# Patient Record
Sex: Female | Born: 1990
Health system: Southern US, Community
[De-identification: ages and names within clinical notes are randomized; demographics above are authoritative.]

## PROBLEM LIST (undated history)

## (undated) DIAGNOSIS — E282 Polycystic ovarian syndrome: Secondary | ICD-10-CM

## (undated) DIAGNOSIS — Z8619 Personal history of other infectious and parasitic diseases: Secondary | ICD-10-CM

## (undated) DIAGNOSIS — F419 Anxiety disorder, unspecified: Secondary | ICD-10-CM

## (undated) HISTORY — PX: OTHER SURGICAL HISTORY: SHX169

## (undated) HISTORY — DX: Polycystic ovarian syndrome: E28.2

## (undated) HISTORY — DX: Anxiety disorder, unspecified: F41.9

## (undated) HISTORY — PX: WISDOM TOOTH EXTRACTION: SHX21

## (undated) HISTORY — DX: Personal history of other infectious and parasitic diseases: Z86.19

---
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
## 2010-02-04 ENCOUNTER — Emergency Department (HOSPITAL_COMMUNITY)
Admission: EM | Admit: 2010-02-04 | Discharge: 2010-02-04 | Payer: Self-pay | Source: Home / Self Care | Admitting: Family Medicine

## 2010-02-04 LAB — POCT RAPID STREP A (OFFICE): Streptococcus, Group A Screen (Direct): NEGATIVE

## 2012-07-18 ENCOUNTER — Emergency Department (HOSPITAL_COMMUNITY)
Admission: EM | Admit: 2012-07-18 | Discharge: 2012-07-18 | Disposition: A | Discharge status: Home / Self Care | Payer: 59 | Source: Home / Self Care | Attending: Family Medicine | Admitting: Family Medicine

## 2012-07-18 ENCOUNTER — Encounter (HOSPITAL_COMMUNITY): Payer: Self-pay | Admitting: Emergency Medicine

## 2012-07-18 DIAGNOSIS — R059 Cough, unspecified: Secondary | ICD-10-CM

## 2012-07-18 DIAGNOSIS — M549 Dorsalgia, unspecified: Secondary | ICD-10-CM

## 2012-07-18 DIAGNOSIS — R05 Cough: Secondary | ICD-10-CM

## 2012-07-18 LAB — POCT PREGNANCY, URINE: Preg Test, Ur: NEGATIVE

## 2012-07-18 LAB — POCT URINALYSIS DIP (DEVICE)
Protein, ur: NEGATIVE mg/dL
Specific Gravity, Urine: >=1.03 (ref 1.005–1.030)
Urobilinogen, UA: 0.2 mg/dL (ref 0.0–1.0)

## 2012-07-18 MED ORDER — CYCLOBENZAPRINE HCL 5 MG PO TABS: 5.0000 mg | ORAL_TABLET | Freq: Three times a day (TID) | ORAL | Status: DC | PRN

## 2012-07-18 MED ORDER — FLUTICASONE PROPIONATE 50 MCG/ACT NA SUSP: 2.0000 | Freq: Every day | NASAL | Status: DC

## 2012-07-18 MED ORDER — DEXTROMETHORPHAN POLISTIREX 30 MG/5ML PO LQCR: 30.0000 mg | Freq: Two times a day (BID) | ORAL | Status: DC

## 2012-07-18 NOTE — ED Notes (Signed)
Pt c/o a persistent deep productive cough x [redacted] wks along with back pain.  Pt ?'s if it from cough so hard. Pt denies urinary symptoms.  Pt has tried otc medications with no relief in symptoms.  Pt states that she has seen a chiropractor and states that she has a small misalignment in her back.

## 2012-07-18 NOTE — ED Provider Notes (Signed)
   History    CSN: [id number] Arrival date & time 07/18/12  1633  First MD Initiated Contact with Patient 07/18/12 1721     Chief Complaint  Patient presents with  . Cough    deep productive cough x 2 wks.  lower back pain ?'s if its because of cough. pt denies urinary symptoms.    HPI Cough: started 2wks ago. Chronic h/o intermittent coughing episodes, since high school. Typically last a few days. Non-smoker. Denies rhinorrhea, recent illnesses, fevers, allergies. Tried mucinex, Theraflu, zyrtec w/o any relief. No h/o chronic respiratory illnesses. Cough is worse at night. Occasionally productive.   Lower back pain: Started 3 wks ago. Went to chiropractor who stated pt had a misalignment. Can not afford adjustments from chiropractor. ASA w/o benefit. Denies any trauma. Pain is sharp and deep. Exacerbated w/ certain movements.    History reviewed. No pertinent past medical history. History reviewed. No pertinent past surgical history. History reviewed. No pertinent family history. History  Substance Use Topics  . Smoking status: Never Smoker   . Smokeless tobacco: Not on file  . Alcohol Use: No   OB History   Grav Para Term Preterm Abortions TAB SAB Ect Mult Living                 Review of Systems Per HPI w/ all other systems negative Allergies  Review of patient's allergies indicates no known allergies.  Home Medications  No current outpatient prescriptions on file. BP 131/85  Pulse 70  Temp(Src) 99.3 F (37.4 C) (Oral)  Resp 18  SpO2 99% Physical Exam Gen: NAD, WNWD HEENT: Nasal turbinates pink, tonsils 2+, no pharyngeal injection, no cervical lymphadenopathy CV: RRR, no m/r/g Res: CTAB. , nml effort Abd: non-distended Ext: no edema MSK: leg length equal, mild reproducible pain on palpation in the lumbar perispinal region. FROM of back.       ED Course  Procedures (including critical care time) Labs Reviewed - No data to display No results found. No  diagnosis found.  MDM  22yo AAF w/ chronic cough w/ current exacerbation and back pain. Cough likely secondary to post nasal drip. Back pain is MSK pain in nature likely related to muscle spasm vs possible malalignment - Flonase - Delsym - Exercises for back. Demonstrated - Flexeril - NSAID's   Shelly Flatten, MD Family Medicine PGY-3 07/18/2012, 5:55 PM    Ozella Rocks, MD 07/18/12 [phone]

## 2012-07-19 NOTE — ED Provider Notes (Signed)
Medical screening examination/treatment/procedure(s) were performed by resident physician or non-physician practitioner and as supervising physician I was immediately available for consultation/collaboration.   Cady Hafen DOUGLAS MD.   Sharleen Szczesny D Karry Barrilleaux, MD 07/19/12 1639 

## 2012-09-28 ENCOUNTER — Encounter (HOSPITAL_BASED_OUTPATIENT_CLINIC_OR_DEPARTMENT_OTHER): Payer: Self-pay

## 2012-09-28 ENCOUNTER — Emergency Department (HOSPITAL_BASED_OUTPATIENT_CLINIC_OR_DEPARTMENT_OTHER)
Admission: EM | Admit: 2012-09-28 | Discharge: 2012-09-28 | Disposition: A | Discharge status: Home / Self Care | Payer: 59 | Attending: Emergency Medicine | Admitting: Emergency Medicine

## 2012-09-28 DIAGNOSIS — S46909A Unspecified injury of unspecified muscle, fascia and tendon at shoulder and upper arm level, unspecified arm, initial encounter: Secondary | ICD-10-CM | POA: Insufficient documentation

## 2012-09-28 DIAGNOSIS — S0990XA Unspecified injury of head, initial encounter: Secondary | ICD-10-CM | POA: Insufficient documentation

## 2012-09-28 DIAGNOSIS — Y92410 Unspecified street and highway as the place of occurrence of the external cause: Secondary | ICD-10-CM | POA: Insufficient documentation

## 2012-09-28 DIAGNOSIS — IMO0002 Reserved for concepts with insufficient information to code with codable children: Secondary | ICD-10-CM | POA: Insufficient documentation

## 2012-09-28 DIAGNOSIS — Y9389 Activity, other specified: Secondary | ICD-10-CM | POA: Insufficient documentation

## 2012-09-28 DIAGNOSIS — S4980XA Other specified injuries of shoulder and upper arm, unspecified arm, initial encounter: Secondary | ICD-10-CM | POA: Insufficient documentation

## 2012-09-28 MED ORDER — CYCLOBENZAPRINE HCL 10 MG PO TABS: 5.0000 mg | ORAL_TABLET | Freq: Two times a day (BID) | ORAL | Status: DC | PRN

## 2012-09-28 MED ORDER — OXYCODONE-ACETAMINOPHEN 5-325 MG PO TABS: 1.0000 | ORAL_TABLET | Freq: Four times a day (QID) | ORAL | Status: DC | PRN
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
## 2014-05-04 LAB — HM PAP SMEAR: HM PAP: NORMAL

## 2014-06-26 ENCOUNTER — Inpatient Hospital Stay (HOSPITAL_COMMUNITY)
Admission: EM | Admit: 2014-06-26 | Discharge: 2014-06-28 | DRG: 418 | Disposition: A | Discharge status: Home / Self Care | Payer: 59 | Attending: General Surgery | Admitting: General Surgery

## 2014-06-26 ENCOUNTER — Emergency Department (HOSPITAL_COMMUNITY): Payer: 59

## 2014-06-26 ENCOUNTER — Encounter (HOSPITAL_COMMUNITY): Payer: Self-pay | Admitting: Emergency Medicine

## 2014-06-26 DIAGNOSIS — R1013 Epigastric pain: Secondary | ICD-10-CM | POA: Diagnosis not present

## 2014-06-26 DIAGNOSIS — K821 Hydrops of gallbladder: Secondary | ICD-10-CM | POA: Diagnosis present

## 2014-06-26 DIAGNOSIS — K8000 Calculus of gallbladder with acute cholecystitis without obstruction: Secondary | ICD-10-CM | POA: Diagnosis present

## 2014-06-26 DIAGNOSIS — K8050 Calculus of bile duct without cholangitis or cholecystitis without obstruction: Secondary | ICD-10-CM

## 2014-06-26 DIAGNOSIS — R109 Unspecified abdominal pain: Secondary | ICD-10-CM

## 2014-06-26 LAB — LIPASE, BLOOD: Lipase: 21 U/L — ABNORMAL LOW (ref 22–51)

## 2014-06-26 LAB — POC URINE PREG, ED: Preg Test, Ur: NEGATIVE

## 2014-06-26 LAB — CBC WITH DIFFERENTIAL/PLATELET
Basophils Absolute: 0 10*3/uL (ref 0.0–0.1)
Basophils Relative: 0 % (ref 0–1)
Eosinophils Absolute: 0.2 10*3/uL (ref 0.0–0.7)
Eosinophils Relative: 2 % (ref 0–5)
HEMATOCRIT: 41.6 % (ref 36.0–46.0)
HEMOGLOBIN: 13.8 g/dL (ref 12.0–15.0)
LYMPHS ABS: 3.6 10*3/uL (ref 0.7–4.0)
LYMPHS PCT: 36 % (ref 12–46)
MCH: 29.2 pg (ref 26.0–34.0)
MCHC: 33.2 g/dL (ref 30.0–36.0)
MCV: 87.9 fL (ref 78.0–100.0)
MONO ABS: 0.8 10*3/uL (ref 0.1–1.0)
MONOS PCT: 7 % (ref 3–12)
NEUTROS ABS: 5.5 10*3/uL (ref 1.7–7.7)
NEUTROS PCT: 55 % (ref 43–77)
Platelets: 246 10*3/uL (ref 150–400)
RBC: 4.73 MIL/uL (ref 3.87–5.11)
RDW: 13.2 % (ref 11.5–15.5)
WBC: 10.1 10*3/uL (ref 4.0–10.5)

## 2014-06-26 LAB — URINALYSIS, ROUTINE W REFLEX MICROSCOPIC
Bilirubin Urine: NEGATIVE
GLUCOSE, UA: NEGATIVE mg/dL
Ketones, ur: NEGATIVE mg/dL
LEUKOCYTES UA: NEGATIVE
Nitrite: NEGATIVE
PH: 5.5 (ref 5.0–8.0)
Protein, ur: NEGATIVE mg/dL
SPECIFIC GRAVITY, URINE: 1.028 (ref 1.005–1.030)
UROBILINOGEN UA: 0.2 mg/dL (ref 0.0–1.0)

## 2014-06-26 LAB — COMPREHENSIVE METABOLIC PANEL
ALT: 26 U/L (ref 14–54)
ANION GAP: 6 (ref 5–15)
AST: 23 U/L (ref 15–41)
Albumin: 3.4 g/dL — ABNORMAL LOW (ref 3.5–5.0)
Alkaline Phosphatase: 55 U/L (ref 38–126)
BUN: 11 mg/dL (ref 6–20)
CALCIUM: 9.1 mg/dL (ref 8.9–10.3)
CO2: 28 mmol/L (ref 22–32)
CREATININE: 0.84 mg/dL (ref 0.44–1.00)
Chloride: 105 mmol/L (ref 101–111)
GLUCOSE: 101 mg/dL — AB (ref 65–99)
Potassium: 3.9 mmol/L (ref 3.5–5.1)
Sodium: 139 mmol/L (ref 135–145)
Total Bilirubin: 0.6 mg/dL (ref 0.3–1.2)
Total Protein: 6.6 g/dL (ref 6.5–8.1)

## 2014-06-26 LAB — URINE MICROSCOPIC-ADD ON

## 2014-06-26 LAB — I-STAT TROPONIN, ED: Troponin i, poc: 0 ng/mL (ref 0.00–0.08)

## 2014-06-26 LAB — SURGICAL PCR SCREEN
MRSA, PCR: NEGATIVE
Staphylococcus aureus: NEGATIVE

## 2014-06-26 MED ORDER — DEXTROSE 5 % IV SOLN
2.0000 g | INTRAVENOUS | Status: DC
  Administered 2014-06-26: 2 g via INTRAVENOUS
  Filled 2014-06-26 (×3): qty 2

## 2014-06-26 MED ORDER — HYDROMORPHONE HCL 1 MG/ML IJ SOLN
1.0000 mg | Freq: Once | INTRAMUSCULAR | Status: AC
  Administered 2014-06-26: 1 mg via INTRAVENOUS
  Filled 2014-06-26: qty 1

## 2014-06-26 MED ORDER — HEPARIN SODIUM (PORCINE) 5000 UNIT/ML IJ SOLN
5000.0000 [IU] | Freq: Three times a day (TID) | INTRAMUSCULAR | Status: AC
  Administered 2014-06-26 (×2): 5000 [IU] via SUBCUTANEOUS
  Filled 2014-06-26 (×2): qty 1

## 2014-06-26 MED ORDER — HYOSCYAMINE SULFATE 0.125 MG PO TABS
0.1250 mg | ORAL_TABLET | Freq: Once | ORAL | Status: AC
  Administered 2014-06-26: 0.125 mg via ORAL
  Filled 2014-06-26: qty 1

## 2014-06-26 MED ORDER — KCL IN DEXTROSE-NACL 20-5-0.45 MEQ/L-%-% IV SOLN
INTRAVENOUS | Status: DC
  Administered 2014-06-26 – 2014-06-28 (×3): via INTRAVENOUS
  Filled 2014-06-26 (×5): qty 1000

## 2014-06-26 MED ORDER — HYDROMORPHONE HCL 1 MG/ML IJ SOLN
0.5000 mg | INTRAMUSCULAR | Status: DC | PRN
  Administered 2014-06-26 – 2014-06-28 (×7): 1 mg via INTRAVENOUS
  Filled 2014-06-26 (×7): qty 1

## 2014-06-26 MED ORDER — OXYCODONE-ACETAMINOPHEN 5-325 MG PO TABS
1.0000 | ORAL_TABLET | ORAL | Status: DC | PRN
  Administered 2014-06-26: 2 via ORAL
  Filled 2014-06-26 (×2): qty 2

## 2014-06-26 MED ORDER — DIPHENHYDRAMINE HCL 50 MG/ML IJ SOLN
12.5000 mg | Freq: Four times a day (QID) | INTRAMUSCULAR | Status: DC | PRN
  Administered 2014-06-27: 12.5 mg via INTRAVENOUS

## 2014-06-26 MED ORDER — DIPHENHYDRAMINE HCL 12.5 MG/5ML PO ELIX: 12.5000 mg | ORAL_SOLUTION | Freq: Four times a day (QID) | ORAL | Status: DC | PRN

## 2014-06-26 MED ORDER — ONDANSETRON HCL 4 MG/2ML IJ SOLN
4.0000 mg | Freq: Four times a day (QID) | INTRAMUSCULAR | Status: DC | PRN
  Administered 2014-06-26: 4 mg via INTRAVENOUS
  Filled 2014-06-26: qty 2

## 2014-06-26 NOTE — H&P (Signed)
Gina Hernandez is an 24 y.o. female.   Chief Complaint:   Epigastric pain, going to her back and from her side HPI: Normally healthy 24 y/o who just graduated from [employer].  She has been traveling, with no issues This AM she woke up with pain in her back and side on the right.  She had pancreatitis in high school attributed to ETOH.  She drinks socially now.  She drove to the ED with her pain.   Work up in the ED, CBC is normal.  Her CMP is normal.  Lipase is 21.  UA shows a few WBC. CT scan showed:  CT:  1. Multiple large gallstones without clear evidence of acute cholecystitis. In patient with right upper flank pain recommend evaluation cholecystitis. If clinical evaluation equivocal, consider nuclear medicine HIDA scan.   No nephrolithiasis or ureterolithiasis. No obstructive uropathy. This was followed with Ultrasound [place]:  Within the gallbladder, there are several echogenic foci which move and shadow consistent with gallstones. The largest gallstone measures 2.3 cm in length. There is a 1.8 cm gallstone which appears adherent in the region of the neck of the gallbladder. There is no gallbladder wall thickening or pericholecystic fluid. No sonographic Murphy sign noted. Common bile duct:  Diameter: 4 mm. There is no intrahepatic or extrahepatic biliary duct dilatation. On exam she is better but still very tender RUQ, no nausea or vomiting.     History reviewed. No pertinent past medical history.   Hx of pancreatitis in High school   History reviewed. No pertinent past surgical history.   Dental surgery  History reviewed. No pertinent family history. Social History:  reports that she has never smoked. She does not have any smokeless tobacco history on file. She reports that she does not drink alcohol or use illicit drugs.   Cigs:  None Drugs:  None ETOH;  SOCIAL  Allergies: No Known Allergies  Prior to Admission medications   Medication Sig Start Date End Date Taking? Authorizing Provider   cyclobenzaprine (FLEXERIL) 10 MG tablet Take 0.5 tablets (5 mg total) by mouth 2 (two) times daily as needed for muscle spasms. Patient not taking: Reported on 06/26/2014 09/28/12   Pamella Pert, MD  oxyCODONE-acetaminophen (PERCOCET) 5-325 MG per tablet Take 1 tablet by mouth every 6 (six) hours as needed for pain. Patient not taking: Reported on 06/26/2014 09/28/12   Pamella Pert, MD     Results for orders placed or performed during the hospital encounter of 06/26/14 (from the past 48 hour(s))  Comprehensive metabolic panel     Status: Abnormal   Collection Time: 06/26/14  8:43 AM  Result Value Ref Range   Sodium 139 135 - 145 mmol/L   Potassium 3.9 3.5 - 5.1 mmol/L   Chloride 105 101 - 111 mmol/L   CO2 28 22 - 32 mmol/L   Glucose, Bld 101 (H) 65 - 99 mg/dL   BUN 11 6 - 20 mg/dL   Creatinine, Ser 0.84 0.44 - 1.00 mg/dL   Calcium 9.1 8.9 - 10.3 mg/dL   Total Protein 6.6 6.5 - 8.1 g/dL   Albumin 3.4 (L) 3.5 - 5.0 g/dL   AST 23 15 - 41 U/L   ALT 26 14 - 54 U/L   Alkaline Phosphatase 55 38 - 126 U/L   Total Bilirubin 0.6 0.3 - 1.2 mg/dL   GFR calc non Af Amer >60 >60 mL/min   GFR calc Af Amer >60 >60 mL/min    Comment: (NOTE) The eGFR  has been calculated using the CKD EPI equation. This calculation has not been validated in all clinical situations. eGFR's persistently <60 mL/min signify possible Chronic Kidney Disease.    Anion gap 6 5 - 15  CBC with Differential     Status: None   Collection Time: 06/26/14  8:43 AM  Result Value Ref Range   WBC 10.1 4.0 - 10.5 K/uL   RBC 4.73 3.87 - 5.11 MIL/uL   Hemoglobin 13.8 12.0 - 15.0 g/dL   HCT 41.6 36.0 - 46.0 %   MCV 87.9 78.0 - 100.0 fL   MCH 29.2 26.0 - 34.0 pg   MCHC 33.2 30.0 - 36.0 g/dL   RDW 13.2 11.5 - 15.5 %   Platelets 246 150 - 400 K/uL   Neutrophils Relative % 55 43 - 77 %   Neutro Abs 5.5 1.7 - 7.7 K/uL   Lymphocytes Relative 36 12 - 46 %   Lymphs Abs 3.6 0.7 - 4.0 K/uL   Monocytes Relative 7 3 - 12 %    Monocytes Absolute 0.8 0.1 - 1.0 K/uL   Eosinophils Relative 2 0 - 5 %   Eosinophils Absolute 0.2 0.0 - 0.7 K/uL   Basophils Relative 0 0 - 1 %   Basophils Absolute 0.0 0.0 - 0.1 K/uL  Lipase, blood     Status: Abnormal   Collection Time: 06/26/14  8:43 AM  Result Value Ref Range   Lipase 21 (L) 22 - 51 U/L  I-stat troponin, ED     Status: None   Collection Time: 06/26/14  9:03 AM  Result Value Ref Range   Troponin i, poc 0.00 0.00 - 0.08 ng/mL   Comment 3            Comment: Due to the release kinetics of cTnI, a negative result within the first hours of the onset of symptoms does not rule out myocardial infarction with certainty. If myocardial infarction is still suspected, repeat the test at appropriate intervals.   Urinalysis, Routine w reflex microscopic (not at [facility])     Status: Abnormal   Collection Time: 06/26/14 10:37 AM  Result Value Ref Range   Color, Urine YELLOW YELLOW   APPearance CLOUDY (A) CLEAR   Specific Gravity, Urine 1.028 1.005 - 1.030   pH 5.5 5.0 - 8.0   Glucose, UA NEGATIVE NEGATIVE mg/dL   Hgb urine dipstick TRACE (A) NEGATIVE   Bilirubin Urine NEGATIVE NEGATIVE   Ketones, ur NEGATIVE NEGATIVE mg/dL   Protein, ur NEGATIVE NEGATIVE mg/dL   Urobilinogen, UA 0.2 0.0 - 1.0 mg/dL   Nitrite NEGATIVE NEGATIVE   Leukocytes, UA NEGATIVE NEGATIVE  Urine microscopic-add on     Status: Abnormal   Collection Time: 06/26/14 10:37 AM  Result Value Ref Range   Squamous Epithelial / LPF MANY (A) RARE   WBC, UA 0-2 <3 WBC/hpf   RBC / HPF 0-2 <3 RBC/hpf   Bacteria, UA MANY (A) RARE  POC urine preg, ED     Status: None   Collection Time: 06/26/14 11:05 AM  Result Value Ref Range   Preg Test, Ur NEGATIVE NEGATIVE    Comment:        THE SENSITIVITY OF THIS METHODOLOGY IS >24 mIU/mL    Ct Renal Stone Study  06/26/2014   CLINICAL DATA:  Right upper flank pain. Hematuria. History of pancreatitis. Acute onset.  EXAM: CT ABDOMEN AND PELVIS WITHOUT CONTRAST   TECHNIQUE: Multidetector CT imaging of the abdomen and pelvis was performed  following the standard protocol without IV contrast.  COMPARISON:  08/12/2008.  FINDINGS: Lower chest: Lung bases are clear.  Hepatobiliary: No focal hepatic lesions noncontrast exam. No biliary duct dilatation.  There are 3 large gallstones the lumen gallbladder measuring 3.7, 3.4, and 2.6 cm. The gallbladder is mildly distended at 4.2 cm. No pericholecystic fluid is evident. The common bile duct normal caliber.  Pancreas: Pancreas is normal. No ductal dilatation. No pancreatic inflammation.  Spleen: Normal spleen  Adrenals/urinary tract: Adrenal glands and kidneys are normal. The ureters and bladder normal.  Stomach/Bowel: Stomach, small bowel, appendix, and cecum are normal. The colon and rectosigmoid colon are normal.  Vascular/Lymphatic: Abdominal aorta is normal caliber. There is no retroperitoneal or periportal lymphadenopathy. No pelvic lymphadenopathy.  Reproductive: Uterus and ovaries are normal.  Musculoskeletal: Acute findings of the skeleton. There several sclerotic Schmorl's nodes in the thoracic and lumbar endplates.  Other: No free fluid.  IMPRESSION: 1. Multiple large gallstones without clear evidence of acute cholecystitis. In patient with right upper flank pain recommend evaluation cholecystitis. If clinical evaluation equivocal, consider nuclear medicine HIDA scan. 2. No nephrolithiasis or ureterolithiasis.  No obstructive uropathy.   Electronically Signed   By: Suzy Bouchard M.D.   On: 06/26/2014 13:01   US Abdomen Limited Ruq  06/26/2014   CLINICAL DATA:  Right upper abdominal and flank pain  EXAM: US ABDOMEN LIMITED - RIGHT UPPER QUADRANT  COMPARISON:  CT abdomen and pelvis June 26, 2014  FINDINGS: Gallbladder:  Within the gallbladder, there are several echogenic foci which move and shadow consistent with gallstones. The largest gallstone measures 2.3 cm in length. There is a 1.8 cm gallstone which appears  adherent in the region of the neck of the gallbladder. There is no gallbladder wall thickening or pericholecystic fluid. No sonographic Murphy sign noted.  Common bile duct:  Diameter: 4 mm. There is no intrahepatic or extrahepatic biliary duct dilatation.  Liver:  No focal lesion identified. Within normal limits in parenchymal echogenicity.  IMPRESSION: Cholelithiasis. Note that there is a calculus which appears adherent in the neck of the gallbladder. If there is concern for cystic duct obstruction, nuclear medicine hepatobiliary study could be helpful to further evaluate. Study otherwise unremarkable.   Electronically Signed   By: Lowella Grip III M.D.   On: 06/26/2014 14:13    Review of Systems  All other systems reviewed and are negative.   Blood pressure 134/74, pulse 51, temperature 98.3 F (36.8 C), temperature source Oral, resp. rate 16, height 5' 8"  (1.727 m), weight 104.327 kg (230 lb), last menstrual period 04/13/2014, SpO2 100 %. Physical Exam  Constitutional: She is oriented to person, place, and time. She appears well-developed and well-nourished. No distress.  Body mass index is 34.98 kg/(m^2).   HENT:  Head: Normocephalic and atraumatic.  Nose: Nose normal.  Eyes: Conjunctivae and EOM are normal. Right eye exhibits no discharge. Left eye exhibits no discharge. No scleral icterus.  Neck: Normal range of motion. Neck supple. No JVD present. No tracheal deviation present. No thyromegaly present.  Cardiovascular: Normal rate, regular rhythm, normal heart sounds and intact distal pulses.   No murmur heard. Respiratory: Effort normal and breath sounds normal. No respiratory distress. She has no wheezes. She has no rales. She exhibits no tenderness.  GI: Soft. Bowel sounds are normal. She exhibits no distension and no mass. There is tenderness (RUQ). There is no rebound and no guarding.  Abdominal piercings  Musculoskeletal: She exhibits no edema.  Lymphadenopathy:  She has  no cervical adenopathy.  Neurological: She is alert and oriented to person, place, and time. No cranial nerve deficit.  Skin: Skin is warm and dry. No rash noted. She is not diaphoretic. No erythema. No pallor.  Psychiatric: She has a normal mood and affect. Her behavior is normal. Judgment and thought content normal.     Assessment/Plan Acute cholecystitis with cholelithiais Remote hx of pancreatitis Body mass index is 34.98 kg/(m^2).    Plan:  Admit, NPO after MN, antibiotics, hydration, surgery tomorrow.    Izaiyah Kleinman 06/26/2014, 2:59 PM
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
## 2014-06-26 NOTE — ED Notes (Signed)
Patient asked if she could provide a urine sample and she stated she had urinated prior to being placed in exam room and is currently unable to provide a sample.

## 2014-06-26 NOTE — ED Notes (Signed)
Pt undressed, in gown, on monitor, continuous pulse oximetry and blood pressure cuff 

## 2014-06-26 NOTE — ED Notes (Signed)
Pt back from CT. Reports pain remains at a 4/10, pt is comfortable at this time.

## 2014-06-26 NOTE — ED Provider Notes (Signed)
CSN: [id number]     Arrival date & time 06/26/14  2725 History   First MD Initiated Contact with Patient 06/26/14 [phone]     Chief Complaint  Patient presents with  . Abdominal Pain     Patient is a 24 y.o. female presenting with abdominal pain. The history is provided by the patient. No language interpreter was used.  Abdominal Pain  Gina Hernandez presents for evaluation of abdominal pain. She states that she awoke at 7 AM with diffuse  low back pain. This then wrapped around to her epigastrium and right upper quadrant. The pain in her abdomen is described as a tightness. The constant pain. There are no alleviating or worsening factors. She denies any fevers, nausea, vomiting, diarrhea, dysuria, vaginal discharge. She had a similar episode once previously in high school was diagnosed pancreatitis.  Symptoms are moderate and constant.  History reviewed. No pertinent past medical history. History reviewed. No pertinent past surgical history. History reviewed. No pertinent family history. History  Substance Use Topics  . Smoking status: Never Smoker   . Smokeless tobacco: Not on file  . Alcohol Use: No   OB History    No data available     Review of Systems  Gastrointestinal: Positive for abdominal pain.  All other systems reviewed and are negative.     Allergies  Review of patient's allergies indicates no known allergies.  Home Medications   Prior to Admission medications   Medication Sig Start Date End Date Taking? Authorizing Provider  cyclobenzaprine (FLEXERIL) 10 MG tablet Take 0.5 tablets (5 mg total) by mouth 2 (two) times daily as needed for muscle spasms. 09/28/12   Purvis Sheffield, MD  oxyCODONE-acetaminophen (PERCOCET) 5-325 MG per tablet Take 1 tablet by mouth every 6 (six) hours as needed for pain. 09/28/12   Purvis Sheffield, MD   BP 125/79 mmHg  Pulse 70  Temp(Src) 98.3 F (36.8 C) (Oral)  Resp 14  Ht 5\' 8"  (1.727 m)  Wt 230 lb (104.327 kg)  BMI 34.98 kg/m2   SpO2 100%  LMP 04/13/2014 (Approximate) Physical Exam  Constitutional: She is oriented to person, place, and time. She appears well-developed and well-nourished.  HENT:  Head: Normocephalic and atraumatic.  Cardiovascular: Normal rate and regular rhythm.   No murmur heard. Pulmonary/Chest: Effort normal and breath sounds normal. No respiratory distress.  Abdominal: Soft. There is no rebound and no guarding.  Mild RUQ tenderness without guarding or rebound tenderness  Musculoskeletal: She exhibits no edema or tenderness.  Neurological: She is alert and oriented to person, place, and time.  Skin: Skin is warm and dry.  Psychiatric: She has a normal mood and affect. Her behavior is normal.  Nursing note and vitals reviewed.   ED Course  Procedures (including critical care time) Labs Review Labs Reviewed  COMPREHENSIVE METABOLIC PANEL - Abnormal; Notable for the following:    Glucose, Bld 101 (*)    Albumin 3.4 (*)    All other components within normal limits  URINALYSIS, ROUTINE W REFLEX MICROSCOPIC (NOT AT [facility]) - Abnormal; Notable for the following:    APPearance CLOUDY (*)    Hgb urine dipstick TRACE (*)    All other components within normal limits  LIPASE, BLOOD - Abnormal; Notable for the following:    Lipase 21 (*)    All other components within normal limits  URINE MICROSCOPIC-ADD ON - Abnormal; Notable for the following:    Squamous Epithelial / LPF MANY (*)    Bacteria, UA  MANY (*)    All other components within normal limits  URINE CULTURE  CBC WITH DIFFERENTIAL/PLATELET  Rosezena Sensor, ED  POC URINE PREG, ED    Imaging Review Ct Renal Stone Study  06/26/2014   CLINICAL DATA:  Right upper flank pain. Hematuria. History of pancreatitis. Acute onset.  EXAM: CT ABDOMEN AND PELVIS WITHOUT CONTRAST  TECHNIQUE: Multidetector CT imaging of the abdomen and pelvis was performed following the standard protocol without IV contrast.  COMPARISON:  08/12/2008.  FINDINGS:  Lower chest: Lung bases are clear.  Hepatobiliary: No focal hepatic lesions noncontrast exam. No biliary duct dilatation.  There are 3 large gallstones the lumen gallbladder measuring 3.7, 3.4, and 2.6 cm. The gallbladder is mildly distended at 4.2 cm. No pericholecystic fluid is evident. The common bile duct normal caliber.  Pancreas: Pancreas is normal. No ductal dilatation. No pancreatic inflammation.  Spleen: Normal spleen  Adrenals/urinary tract: Adrenal glands and kidneys are normal. The ureters and bladder normal.  Stomach/Bowel: Stomach, small bowel, appendix, and cecum are normal. The colon and rectosigmoid colon are normal.  Vascular/Lymphatic: Abdominal aorta is normal caliber. There is no retroperitoneal or periportal lymphadenopathy. No pelvic lymphadenopathy.  Reproductive: Uterus and ovaries are normal.  Musculoskeletal: Acute findings of the skeleton. There several sclerotic Schmorl's nodes in the thoracic and lumbar endplates.  Other: No free fluid.  IMPRESSION: 1. Multiple large gallstones without clear evidence of acute cholecystitis. In patient with right upper flank pain recommend evaluation cholecystitis. If clinical evaluation equivocal, consider nuclear medicine HIDA scan. 2. No nephrolithiasis or ureterolithiasis.  No obstructive uropathy.   Electronically Signed   By: Genevive Bi M.D.   On: 06/26/2014 13:01   US Abdomen Limited Ruq  06/26/2014   CLINICAL DATA:  Right upper abdominal and flank pain  EXAM: US ABDOMEN LIMITED - RIGHT UPPER QUADRANT  COMPARISON:  CT abdomen and pelvis June 26, 2014  FINDINGS: Gallbladder:  Within the gallbladder, there are several echogenic foci which move and shadow consistent with gallstones. The largest gallstone measures 2.3 cm in length. There is a 1.8 cm gallstone which appears adherent in the region of the neck of the gallbladder. There is no gallbladder wall thickening or pericholecystic fluid. No sonographic Murphy sign noted.  Common bile  duct:  Diameter: 4 mm. There is no intrahepatic or extrahepatic biliary duct dilatation.  Liver:  No focal lesion identified. Within normal limits in parenchymal echogenicity.  IMPRESSION: Cholelithiasis. Note that there is a calculus which appears adherent in the neck of the gallbladder. If there is concern for cystic duct obstruction, nuclear medicine hepatobiliary study could be helpful to further evaluate. Study otherwise unremarkable.   Electronically Signed   By: Bretta Bang III M.D.   On: 06/26/2014 14:13     EKG Interpretation   Date/Time:  Friday June 26 2014 08:27:01 EDT Ventricular Rate:  72 PR Interval:  152 QRS Duration: 87 QT Interval:  375 QTC Calculation: 410 R Axis:   52 Text Interpretation:  Sinus rhythm Confirmed by Lincoln Brigham [phone]) on  06/26/2014 8:34:43 AM      MDM   Final diagnoses:  Abdominal pain  Biliary colic    Patient here for evaluation of abdominal pain. Patient only patient initially declined pain medications. After pain medications administered her pain did improve, but returns after about one hour. Discussed with general surgery regarding symptomatic cholelithiasis.     Tilden Fossa, MD 06/26/14 [phone]

## 2014-06-26 NOTE — ED Notes (Signed)
Pt leaving for CT at current time.  

## 2014-06-26 NOTE — ED Notes (Signed)
Pt presents to ED with complaint of epigastric abdominal pain that began in her lower left back and wrapped around to the front, described as a tight sensation. Denies difficulty with urination. Pt appears to be in NAD. VSS. A/O x4.

## 2014-06-26 NOTE — ED Notes (Signed)
Pt reports blood on toilet paper when wiping, states this has been going on for months. Denies pain with bowel movements. Also reports "random sore throats" ; "something just isn't right" - pt crying at current time.

## 2014-06-27 ENCOUNTER — Inpatient Hospital Stay (HOSPITAL_COMMUNITY): Payer: 59 | Admitting: Anesthesiology

## 2014-06-27 ENCOUNTER — Encounter (HOSPITAL_COMMUNITY): Admission: EM | Disposition: A | Discharge status: Home / Self Care | Payer: Self-pay | Source: Home / Self Care

## 2014-06-27 HISTORY — PX: CHOLECYSTECTOMY: SHX55

## 2014-06-27 LAB — COMPREHENSIVE METABOLIC PANEL
ALK PHOS: 75 U/L (ref 38–126)
ALT: 253 U/L — AB (ref 14–54)
ANION GAP: 5 (ref 5–15)
AST: 281 U/L — ABNORMAL HIGH (ref 15–41)
Albumin: 3 g/dL — ABNORMAL LOW (ref 3.5–5.0)
BILIRUBIN TOTAL: 1.7 mg/dL — AB (ref 0.3–1.2)
BUN: 6 mg/dL (ref 6–20)
CO2: 30 mmol/L (ref 22–32)
Calcium: 8.4 mg/dL — ABNORMAL LOW (ref 8.9–10.3)
Chloride: 103 mmol/L (ref 101–111)
Creatinine, Ser: 0.77 mg/dL (ref 0.44–1.00)
GFR calc Af Amer: >60 mL/min (ref >60)
Glucose, Bld: 101 mg/dL — ABNORMAL HIGH (ref 65–99)
POTASSIUM: 4.1 mmol/L (ref 3.5–5.1)
SODIUM: 138 mmol/L (ref 135–145)
TOTAL PROTEIN: 6.2 g/dL — AB (ref 6.5–8.1)

## 2014-06-27 LAB — CBC
HCT: 40.4 % (ref 36.0–46.0)
HEMOGLOBIN: 13.5 g/dL (ref 12.0–15.0)
MCH: 29 pg (ref 26.0–34.0)
MCHC: 33.4 g/dL (ref 30.0–36.0)
MCV: 86.9 fL (ref 78.0–100.0)
Platelets: 236 10*3/uL (ref 150–400)
RBC: 4.65 MIL/uL (ref 3.87–5.11)
RDW: 13.2 % (ref 11.5–15.5)
WBC: 7.1 10*3/uL (ref 4.0–10.5)

## 2014-06-27 LAB — URINE CULTURE

## 2014-06-27 LAB — LIPASE, BLOOD: Lipase: 20 U/L — ABNORMAL LOW (ref 22–51)

## 2014-06-27 SURGERY — LAPAROSCOPIC CHOLECYSTECTOMY WITH INTRAOPERATIVE CHOLANGIOGRAM
Anesthesia: General | Site: Abdomen

## 2014-06-27 MED ORDER — HYDROCODONE-ACETAMINOPHEN 5-325 MG PO TABS
1.0000 | ORAL_TABLET | ORAL | Status: DC | PRN
  Administered 2014-06-28: 1 via ORAL
  Administered 2014-06-28: 2 via ORAL
  Filled 2014-06-27: qty 1
  Filled 2014-06-27: qty 2

## 2014-06-27 MED ORDER — HYDROMORPHONE HCL 1 MG/ML IJ SOLN
1.0000 mg | Freq: Once | INTRAMUSCULAR | Status: AC
  Administered 2014-06-27: 1 mg via INTRAVENOUS
  Filled 2014-06-27: qty 1

## 2014-06-27 MED ORDER — LIDOCAINE HCL (CARDIAC) 20 MG/ML IV SOLN
INTRAVENOUS | Status: AC
  Filled 2014-06-27: qty 5

## 2014-06-27 MED ORDER — OXYCODONE HCL 5 MG/5ML PO SOLN: 5.0000 mg | Freq: Once | ORAL | Status: DC | PRN

## 2014-06-27 MED ORDER — DEXAMETHASONE SODIUM PHOSPHATE 10 MG/ML IJ SOLN
INTRAMUSCULAR | Status: DC | PRN
  Administered 2014-06-27: 10 mg via INTRAVENOUS

## 2014-06-27 MED ORDER — BUPIVACAINE-EPINEPHRINE 0.25% -1:200000 IJ SOLN
INTRAMUSCULAR | Status: DC | PRN
  Administered 2014-06-27: 16 mL

## 2014-06-27 MED ORDER — CEFTRIAXONE SODIUM IN DEXTROSE 40 MG/ML IV SOLN
2.0000 g | INTRAVENOUS | Status: AC
  Administered 2014-06-27: 2 g via INTRAVENOUS
  Filled 2014-06-27: qty 50

## 2014-06-27 MED ORDER — GLYCOPYRROLATE 0.2 MG/ML IJ SOLN
INTRAMUSCULAR | Status: DC | PRN
  Administered 2014-06-27: 0.4 mg via INTRAVENOUS

## 2014-06-27 MED ORDER — SUCCINYLCHOLINE CHLORIDE 20 MG/ML IJ SOLN
INTRAMUSCULAR | Status: AC
  Filled 2014-06-27: qty 1

## 2014-06-27 MED ORDER — OXYCODONE HCL 5 MG PO TABS: 5.0000 mg | ORAL_TABLET | Freq: Once | ORAL | Status: DC | PRN

## 2014-06-27 MED ORDER — PROPOFOL 10 MG/ML IV BOLUS
INTRAVENOUS | Status: AC
  Filled 2014-06-27: qty 20

## 2014-06-27 MED ORDER — SCOPOLAMINE 1 MG/3DAYS TD PT72: 1.0000 | MEDICATED_PATCH | TRANSDERMAL | Status: DC

## 2014-06-27 MED ORDER — SODIUM CHLORIDE 0.9 % IV SOLN
INTRAVENOUS | Status: DC | PRN
  Administered 2014-06-27: 13:00:00 via INTRAVENOUS

## 2014-06-27 MED ORDER — EPHEDRINE SULFATE 50 MG/ML IJ SOLN
INTRAMUSCULAR | Status: AC
  Filled 2014-06-27: qty 1

## 2014-06-27 MED ORDER — DEXAMETHASONE SODIUM PHOSPHATE 10 MG/ML IJ SOLN
INTRAMUSCULAR | Status: AC
  Filled 2014-06-27: qty 1

## 2014-06-27 MED ORDER — NEOSTIGMINE METHYLSULFATE 10 MG/10ML IV SOLN
INTRAVENOUS | Status: AC
  Filled 2014-06-27: qty 1

## 2014-06-27 MED ORDER — ONDANSETRON HCL 4 MG/2ML IJ SOLN
INTRAMUSCULAR | Status: AC
  Filled 2014-06-27: qty 2

## 2014-06-27 MED ORDER — MIDAZOLAM HCL 2 MG/2ML IJ SOLN
INTRAMUSCULAR | Status: AC
  Filled 2014-06-27: qty 2

## 2014-06-27 MED ORDER — MIDAZOLAM HCL 2 MG/2ML IJ SOLN
INTRAMUSCULAR | Status: DC | PRN
  Administered 2014-06-27: 2 mg via INTRAVENOUS

## 2014-06-27 MED ORDER — VECURONIUM BROMIDE 10 MG IV SOLR
INTRAVENOUS | Status: AC
  Filled 2014-06-27: qty 10

## 2014-06-27 MED ORDER — BUPIVACAINE-EPINEPHRINE (PF) 0.25% -1:200000 IJ SOLN
INTRAMUSCULAR | Status: AC
  Filled 2014-06-27: qty 30

## 2014-06-27 MED ORDER — SODIUM CHLORIDE 0.9 % IJ SOLN
INTRAMUSCULAR | Status: AC
  Filled 2014-06-27: qty 10

## 2014-06-27 MED ORDER — NEOSTIGMINE METHYLSULFATE 10 MG/10ML IV SOLN
INTRAVENOUS | Status: DC | PRN
  Administered 2014-06-27: 2 mg via INTRAVENOUS

## 2014-06-27 MED ORDER — FENTANYL CITRATE (PF) 250 MCG/5ML IJ SOLN
INTRAMUSCULAR | Status: DC | PRN
  Administered 2014-06-27: 150 ug via INTRAVENOUS
  Administered 2014-06-27 (×3): 100 ug via INTRAVENOUS

## 2014-06-27 MED ORDER — LIDOCAINE HCL (CARDIAC) 20 MG/ML IV SOLN
INTRAVENOUS | Status: DC | PRN
  Administered 2014-06-27: 60 mg via INTRAVENOUS

## 2014-06-27 MED ORDER — FENTANYL CITRATE (PF) 250 MCG/5ML IJ SOLN
INTRAMUSCULAR | Status: AC
  Filled 2014-06-27: qty 5

## 2014-06-27 MED ORDER — SCOPOLAMINE 1 MG/3DAYS TD PT72
MEDICATED_PATCH | TRANSDERMAL | Status: DC | PRN
  Administered 2014-06-27: 1 via TRANSDERMAL

## 2014-06-27 MED ORDER — SCOPOLAMINE 1 MG/3DAYS TD PT72
MEDICATED_PATCH | TRANSDERMAL | Status: AC
  Filled 2014-06-27: qty 1

## 2014-06-27 MED ORDER — MEPERIDINE HCL 25 MG/ML IJ SOLN: 6.2500 mg | INTRAMUSCULAR | Status: DC | PRN

## 2014-06-27 MED ORDER — GLYCOPYRROLATE 0.2 MG/ML IJ SOLN
INTRAMUSCULAR | Status: AC
  Filled 2014-06-27: qty 2

## 2014-06-27 MED ORDER — PHENYLEPHRINE 40 MCG/ML (10ML) SYRINGE FOR IV PUSH (FOR BLOOD PRESSURE SUPPORT)
PREFILLED_SYRINGE | INTRAVENOUS | Status: AC
  Filled 2014-06-27: qty 10

## 2014-06-27 MED ORDER — MUPIROCIN 2 % EX OINT
TOPICAL_OINTMENT | CUTANEOUS | Status: AC
  Filled 2014-06-27: qty 22

## 2014-06-27 MED ORDER — LACTATED RINGERS IV SOLN
INTRAVENOUS | Status: DC | PRN
  Administered 2014-06-27 (×2): via INTRAVENOUS

## 2014-06-27 MED ORDER — HYDROMORPHONE HCL 1 MG/ML IJ SOLN
0.2500 mg | INTRAMUSCULAR | Status: DC | PRN
  Administered 2014-06-27 (×2): 0.5 mg via INTRAVENOUS

## 2014-06-27 MED ORDER — PROPOFOL 10 MG/ML IV BOLUS
INTRAVENOUS | Status: DC | PRN
  Administered 2014-06-27: 200 mg via INTRAVENOUS

## 2014-06-27 MED ORDER — VECURONIUM BROMIDE 10 MG IV SOLR
INTRAVENOUS | Status: DC | PRN
  Administered 2014-06-27: 2 mg via INTRAVENOUS

## 2014-06-27 MED ORDER — LACTATED RINGERS IV SOLN
INTRAVENOUS | Status: DC
Start: 2014-06-27 — End: 2014-06-28
  Administered 2014-06-27: 12:00:00 via INTRAVENOUS

## 2014-06-27 MED ORDER — ONDANSETRON HCL 4 MG/2ML IJ SOLN
INTRAMUSCULAR | Status: DC | PRN
  Administered 2014-06-27: 4 mg via INTRAVENOUS

## 2014-06-27 MED ORDER — SODIUM CHLORIDE 0.9 % IR SOLN
Status: DC | PRN
  Administered 2014-06-27: 1000 mL

## 2014-06-27 MED ORDER — 0.9 % SODIUM CHLORIDE (POUR BTL) OPTIME
TOPICAL | Status: DC | PRN
  Administered 2014-06-27: 1000 mL

## 2014-06-27 MED ORDER — ARTIFICIAL TEARS OP OINT
TOPICAL_OINTMENT | OPHTHALMIC | Status: AC
  Filled 2014-06-27: qty 3.5

## 2014-06-27 MED ORDER — GLYCOPYRROLATE 0.2 MG/ML IJ SOLN
INTRAMUSCULAR | Status: AC
  Filled 2014-06-27: qty 3

## 2014-06-27 MED ORDER — SUCCINYLCHOLINE CHLORIDE 20 MG/ML IJ SOLN
INTRAMUSCULAR | Status: DC | PRN
  Administered 2014-06-27: 120 mg via INTRAVENOUS

## 2014-06-27 MED ORDER — DIPHENHYDRAMINE HCL 50 MG/ML IJ SOLN
INTRAMUSCULAR | Status: AC
  Administered 2014-06-27: 12.5 mg via INTRAVENOUS
  Filled 2014-06-27: qty 1

## 2014-06-27 MED ORDER — HYDROMORPHONE HCL 1 MG/ML IJ SOLN
INTRAMUSCULAR | Status: AC
  Filled 2014-06-27: qty 1

## 2014-06-27 MED ORDER — CEFTRIAXONE SODIUM IN DEXTROSE 20 MG/ML IV SOLN
1.0000 g | INTRAVENOUS | Status: DC
  Filled 2014-06-27: qty 50

## 2014-06-27 MED ORDER — ROCURONIUM BROMIDE 50 MG/5ML IV SOLN
INTRAVENOUS | Status: AC
  Filled 2014-06-27: qty 1

## 2014-06-27 SURGICAL SUPPLY — 38 items
APPLIER CLIP 5 13 M/L LIGAMAX5 (MISCELLANEOUS) ×2
BLADE SURG ROTATE 9660 (MISCELLANEOUS) IMPLANT
CANISTER SUCTION 2500CC (MISCELLANEOUS) ×2 IMPLANT
CHLORAPREP W/TINT 26ML (MISCELLANEOUS) ×2 IMPLANT
CLIP APPLIE 5 13 M/L LIGAMAX5 (MISCELLANEOUS) ×1 IMPLANT
COVER MAYO STAND STRL (DRAPES) ×2 IMPLANT
COVER SURGICAL LIGHT HANDLE (MISCELLANEOUS) ×2 IMPLANT
DRAPE C-ARM 42X72 X-RAY (DRAPES) IMPLANT
ELECT REM PT RETURN 9FT ADLT (ELECTROSURGICAL) ×2
ELECTRODE REM PT RTRN 9FT ADLT (ELECTROSURGICAL) ×1 IMPLANT
FILTER SMOKE EVAC LAPAROSHD (FILTER) IMPLANT
GLOVE BIO SURGEON STRL SZ8 (GLOVE) ×2 IMPLANT
GLOVE BIOGEL PI IND STRL 8 (GLOVE) ×1 IMPLANT
GLOVE BIOGEL PI INDICATOR 8 (GLOVE) ×1
GOWN STRL REUS W/ TWL LRG LVL3 (GOWN DISPOSABLE) ×1 IMPLANT
GOWN STRL REUS W/ TWL XL LVL3 (GOWN DISPOSABLE) ×2 IMPLANT
GOWN STRL REUS W/TWL LRG LVL3 (GOWN DISPOSABLE) ×1
GOWN STRL REUS W/TWL XL LVL3 (GOWN DISPOSABLE) ×2
KIT BASIN OR (CUSTOM PROCEDURE TRAY) ×2 IMPLANT
KIT ROOM TURNOVER OR (KITS) ×2 IMPLANT
LIQUID BAND (GAUZE/BANDAGES/DRESSINGS) ×2 IMPLANT
NS IRRIG 1000ML POUR BTL (IV SOLUTION) ×2 IMPLANT
PAD ARMBOARD 7.5X6 YLW CONV (MISCELLANEOUS) ×2 IMPLANT
POUCH RETRIEVAL ECOSAC 10 (ENDOMECHANICALS) ×1 IMPLANT
POUCH RETRIEVAL ECOSAC 10MM (ENDOMECHANICALS) ×1
SCISSORS LAP 5X35 DISP (ENDOMECHANICALS) ×2 IMPLANT
SET CHOLANGIOGRAPH 5 50 .035 (SET/KITS/TRAYS/PACK) IMPLANT
SET IRRIG TUBING LAPAROSCOPIC (IRRIGATION / IRRIGATOR) ×2 IMPLANT
SLEEVE ENDOPATH XCEL 5M (ENDOMECHANICALS) ×4 IMPLANT
SPECIMEN JAR SMALL (MISCELLANEOUS) ×2 IMPLANT
SUT VIC AB 4-0 PS2 27 (SUTURE) ×2 IMPLANT
SUT VICRYL 0 UR6 27IN ABS (SUTURE) ×2 IMPLANT
TOWEL OR 17X24 6PK STRL BLUE (TOWEL DISPOSABLE) ×2 IMPLANT
TOWEL OR 17X26 10 PK STRL BLUE (TOWEL DISPOSABLE) ×2 IMPLANT
TRAY LAPAROSCOPIC (CUSTOM PROCEDURE TRAY) ×2 IMPLANT
TROCAR XCEL BLUNT TIP 100MML (ENDOMECHANICALS) ×2 IMPLANT
TROCAR XCEL NON-BLD 5MMX100MML (ENDOMECHANICALS) ×2 IMPLANT
TUBING INSUFFLATION (TUBING) ×2 IMPLANT

## 2014-06-27 NOTE — Anesthesia Preprocedure Evaluation (Addendum)

## 2014-06-27 NOTE — Progress Notes (Signed)
Subjective: RUQ pain  Objective: Vital signs in last 24 hours: Temp:  [97.5 F (36.4 C)-98.6 F (37 C)] 98.6 F (37 C) (06/18 0531) Pulse Rate:  [48-71] 52 (06/18 0531) Resp:  [9-18] 16 (06/18 0531) BP: (85-143)/(48-96) 101/57 mmHg (06/18 0532) SpO2:  [97 %-100 %] 100 % (06/18 0531) Weight:  [104.327 kg (230 lb)] 104.327 kg (230 lb) (06/17 0828) Last BM Date: 06/25/14  Intake/Output from previous day: 06/17 0701 - 06/18 0700 In: 360 [P.O.:360] Out: -  Intake/Output this shift:    General appearance: alert and cooperative Resp: clear to auscultation bilaterally Cardio: regular rate and rhythm GI: RUQ tenderness  Lab Results:   Recent Labs  06/26/14 0843 06/27/14 0425  WBC 10.1 7.1  HGB 13.8 13.5  HCT 41.6 40.4  PLT 246 236   BMET  Recent Labs  06/26/14 0843 06/27/14 0425  NA 139 138  K 3.9 4.1  CL 105 103  CO2 28 30  GLUCOSE 101* 101*  BUN 11 6  CREATININE 0.84 0.77  CALCIUM 9.1 8.4*   PT/INR No results for input(s): LABPROT, INR in the last 72 hours. ABG No results for input(s): PHART, HCO3 in the last 72 hours.  Invalid input(s): PCO2, PO2  Studies/Results: Ct Renal Stone Study  06/26/2014   CLINICAL DATA:  Right upper flank pain. Hematuria. History of pancreatitis. Acute onset.  EXAM: CT ABDOMEN AND PELVIS WITHOUT CONTRAST  TECHNIQUE: Multidetector CT imaging of the abdomen and pelvis was performed following the standard protocol without IV contrast.  COMPARISON:  08/12/2008.  FINDINGS: Lower chest: Lung bases are clear.  Hepatobiliary: No focal hepatic lesions noncontrast exam. No biliary duct dilatation.  There are 3 large gallstones the lumen gallbladder measuring 3.7, 3.4, and 2.6 cm. The gallbladder is mildly distended at 4.2 cm. No pericholecystic fluid is evident. The common bile duct normal caliber.  Pancreas: Pancreas is normal. No ductal dilatation. No pancreatic inflammation.  Spleen: Normal spleen  Adrenals/urinary tract: Adrenal glands  and kidneys are normal. The ureters and bladder normal.  Stomach/Bowel: Stomach, small bowel, appendix, and cecum are normal. The colon and rectosigmoid colon are normal.  Vascular/Lymphatic: Abdominal aorta is normal caliber. There is no retroperitoneal or periportal lymphadenopathy. No pelvic lymphadenopathy.  Reproductive: Uterus and ovaries are normal.  Musculoskeletal: Acute findings of the skeleton. There several sclerotic Schmorl's nodes in the thoracic and lumbar endplates.  Other: No free fluid.  IMPRESSION: 1. Multiple large gallstones without clear evidence of acute cholecystitis. In patient with right upper flank pain recommend evaluation cholecystitis. If clinical evaluation equivocal, consider nuclear medicine HIDA scan. 2. No nephrolithiasis or ureterolithiasis.  No obstructive uropathy.   Electronically Signed   By: Genevive Bi M.D.   On: 06/26/2014 13:01   US Abdomen Limited Ruq  06/26/2014   CLINICAL DATA:  Right upper abdominal and flank pain  EXAM: US ABDOMEN LIMITED - RIGHT UPPER QUADRANT  COMPARISON:  CT abdomen and pelvis June 26, 2014  FINDINGS: Gallbladder:  Within the gallbladder, there are several echogenic foci which move and shadow consistent with gallstones. The largest gallstone measures 2.3 cm in length. There is a 1.8 cm gallstone which appears adherent in the region of the neck of the gallbladder. There is no gallbladder wall thickening or pericholecystic fluid. No sonographic Murphy sign noted.  Common bile duct:  Diameter: 4 mm. There is no intrahepatic or extrahepatic biliary duct dilatation.  Liver:  No focal lesion identified. Within normal limits in parenchymal echogenicity.  IMPRESSION: Cholelithiasis.  Note that there is a calculus which appears adherent in the neck of the gallbladder. If there is concern for cystic duct obstruction, nuclear medicine hepatobiliary study could be helpful to further evaluate. Study otherwise unremarkable.   Electronically Signed   By:  Bretta Bang III M.D.   On: 06/26/2014 14:13    Anti-infectives: Anti-infectives    Start     Dose/Rate Route Frequency Ordered Stop   06/26/14 1600  cefTRIAXone (ROCEPHIN) 2 g in dextrose 5 % 50 mL IVPB     2 g 100 mL/hr over 30 Minutes Intravenous Every 24 hours 06/26/14 1548        Assessment/Plan: Acute cholecystitis - IV Rocephin. To OR for lap chole today. Procedure, risks, and benefits discussed. We discussed the expected post-op course. She is agreeable.   LOS: 1 day    Nitasha Jewel E 06/27/2014

## 2014-06-27 NOTE — Op Note (Signed)
06/26/2014 - 06/27/2014  2:14 PM  PATIENT:  Gina Hernandez  24 y.o. female  PRE-OPERATIVE DIAGNOSIS:  Calculus cholecystitis  POST-OPERATIVE DIAGNOSIS:  Calculus cholecystitis  PROCEDURE:  Procedure(s): LAPAROSCOPIC CHOLECYSTECTOMY   SURGEON:  Surgeon(s): Violeta Gelinas, MD  ASSISTANTS: none   ANESTHESIA:   local and general  EBL:  Total I/O In: 1250 [I.V.:1250] Out: 10 [Blood:10]  BLOOD ADMINISTERED:none  DRAINS: none   SPECIMEN:  Excision  DISPOSITION OF SPECIMEN:  PATHOLOGY  COUNTS:  YES  DICTATION: .Dragon Dictation Gina Hernandez presents for cholecystectomy. She was identified in the preop holding area. She received intravenous antibiotics. Informed consent was obtained. She was brought to the operating room and general endotracheal anesthesia was administered by the anesthesia staff. Prepped and draped in a sterile fashion. Informed local region was infiltrated with local. Infraumbilical incision was made. Subcutaneous tissues were dissected down revealing the anterior fascia. This was divided sharply along the midline and the peritoneal cavity was entered. 0 Vicryl pursestring was placed on the fascial opening. Hassan trocar was inserted in the abdomen was insufflated with carbon dioxide in standard fashion. Under direct vision, a 5 mm epigastric and 5 mm right lateral ports 2 were placed. Local was used at each port site. The gallbladder was inspected and was tense and distended. It was acutely inflamed. Some omental adhesions were swept down revealing the infundibulum. It was then drained using a Nahzat drainage system. It contained clear bile consistent with hydrops. The infundibulum was then able to be grabbed and retracted inferolaterally. The dome was retracted superior medially. Dissection began laterally and progressed medially easily identifying the cystic duct. Dissection continued until we had a critical view there. Due to large gallstones and normal liver function test,  nuclear was done. 3 clips were placed proximal the cystic duct, one was placed distally and it was divided. Further dissection revealed the cystic artery. This was clipped twice proximally, once distally and divided. Gallbladder was then taken off the liver bed using Bovie cautery. We achieved excellent hemostasis along the way. The gallbladder was placed in a bag and removed from the abdomen via the infraumbilical port site. Liver bed was irrigated irrigation fluid returned clear and the liver bed was dry. Clips remain in good position. Irrigation fluid was evacuated. Ports were removed under vision. Pneumoperitoneum was released. Infra-umbilical fascia was closed by tying the pursestring suture. An additional stitch of 0 Vicryl was placed to reinforce the closure. All 4 wounds were irrigated and the skin of each was closed with running 4 Vicryl subcuticular followed by liquid band. All counts were correct. Patient tolerated procedure well without apparent complication and was taken recovery in stable condition. PATIENT DISPOSITION:  PACU - hemodynamically stable.   Delay start of Pharmacological VTE agent (>24hrs) due to surgical blood loss or risk of bleeding:  no  Violeta Gelinas, MD, MPH, FACS Pager: [phone]  6/18/20162:14 PM

## 2014-06-27 NOTE — Progress Notes (Signed)
Received from PACU. A/Ox4, denies nausea/pain at this time.

## 2014-06-27 NOTE — Transfer of Care (Signed)
Immediate Anesthesia Transfer of Care Note  Patient: Gina Hernandez  Procedure(s) Performed: Procedure(s): LAPAROSCOPIC CHOLECYSTECTOMY  (N/A)  Patient Location: PACU  Anesthesia Type:General  Level of Consciousness: awake  Airway & Oxygen Therapy: Patient Spontanous Breathing and Patient connected to nasal cannula oxygen  Post-op Assessment: Report given to RN and Post -op Vital signs reviewed and stable  Post vital signs: Reviewed and stable  Last Vitals:  Filed Vitals:   06/27/14 1136  BP: 129/85  Pulse: 70  Temp: 36.8 C  Resp: 17    Complications: No apparent anesthesia complications

## 2014-06-27 NOTE — Anesthesia Postprocedure Evaluation (Signed)
  Anesthesia Post-op Note  Patient: Gina Hernandez  Procedure(s) Performed: Procedure(s): LAPAROSCOPIC CHOLECYSTECTOMY  (N/A)  Patient Location: PACU  Anesthesia Type: General   Level of Consciousness: awake, alert  and oriented  Airway and Oxygen Therapy: Patient Spontanous Breathing  Post-op Pain: mild  Post-op Assessment: Post-op Vital signs reviewed  Post-op Vital Signs: Reviewed  Last Vitals:  Filed Vitals:   06/27/14 1430  BP: 121/66  Pulse: 63  Temp:   Resp: 6    Complications: No apparent anesthesia complications

## 2014-06-27 NOTE — Progress Notes (Signed)
Silver colored tragus ring removed by anesthesia placed in bag and on chart with label.

## 2014-06-27 NOTE — Anesthesia Procedure Notes (Signed)
Procedure Name: Intubation Date/Time: 06/27/2014 1:01 PM Performed by: Alanda Amass A Pre-anesthesia Checklist: Patient identified, Timeout performed, Emergency Drugs available, Suction available and Patient being monitored Patient Re-evaluated:Patient Re-evaluated prior to inductionOxygen Delivery Method: Circle system utilized Preoxygenation: Pre-oxygenation with 100% oxygen Intubation Type: IV induction Laryngoscope Size: Mac and 3 Grade View: Grade I Tube type: Oral Tube size: 7.5 mm Number of attempts: 1 Airway Equipment and Method: Stylet Placement Confirmation: ETT inserted through vocal cords under direct vision,  positive ETCO2 and breath sounds checked- equal and bilateral Secured at: 21 cm Tube secured with: Tape Dental Injury: Teeth and Oropharynx as per pre-operative assessment

## 2014-06-28 MED ORDER — HYDROCODONE-ACETAMINOPHEN 5-325 MG PO TABS: 1.0000 | ORAL_TABLET | ORAL | Status: DC | PRN

## 2014-06-28 NOTE — Progress Notes (Signed)
Patient ID: Gina Hernandez, female   DOB: 1990-01-19, 24 y.o.   MRN: [id number] 1 Day Post-Op  Subjective: Doing well. Preop pain relieved, just sore. No nausea.  Objective: Vital signs in last 24 hours: Temp:  [97.6 F (36.4 C)-98.3 F (36.8 C)] 98 F (36.7 C) (06/19 0541) Pulse Rate:  [56-70] 67 (06/19 0541) Resp:  [6-17] 16 (06/19 0541) BP: (109-129)/(61-88) 112/62 mmHg (06/19 0541) SpO2:  [97 %-100 %] 100 % (06/19 0541) Last BM Date: 06/25/14  Intake/Output from previous day: 06/18 0701 - 06/19 0700 In: 3124 [P.O.:540; I.V.:2584] Out: 10 [Blood:10] Intake/Output this shift:    General appearance: alert, cooperative and no distress GI: normal findings: soft, non-tender Incision/Wound: clean and dry  Lab Results:   Recent Labs  06/26/14 0843 06/27/14 0425  WBC 10.1 7.1  HGB 13.8 13.5  HCT 41.6 40.4  PLT 246 236   BMET  Recent Labs  06/26/14 0843 06/27/14 0425  NA 139 138  K 3.9 4.1  CL 105 103  CO2 28 30  GLUCOSE 101* 101*  BUN 11 6  CREATININE 0.84 0.77  CALCIUM 9.1 8.4*     Studies/Results: Ct Renal Stone Study  06/26/2014   CLINICAL DATA:  Right upper flank pain. Hematuria. History of pancreatitis. Acute onset.  EXAM: CT ABDOMEN AND PELVIS WITHOUT CONTRAST  TECHNIQUE: Multidetector CT imaging of the abdomen and pelvis was performed following the standard protocol without IV contrast.  COMPARISON:  08/12/2008.  FINDINGS: Lower chest: Lung bases are clear.  Hepatobiliary: No focal hepatic lesions noncontrast exam. No biliary duct dilatation.  There are 3 large gallstones the lumen gallbladder measuring 3.7, 3.4, and 2.6 cm. The gallbladder is mildly distended at 4.2 cm. No pericholecystic fluid is evident. The common bile duct normal caliber.  Pancreas: Pancreas is normal. No ductal dilatation. No pancreatic inflammation.  Spleen: Normal spleen  Adrenals/urinary tract: Adrenal glands and kidneys are normal. The ureters and bladder normal.  Stomach/Bowel:  Stomach, small bowel, appendix, and cecum are normal. The colon and rectosigmoid colon are normal.  Vascular/Lymphatic: Abdominal aorta is normal caliber. There is no retroperitoneal or periportal lymphadenopathy. No pelvic lymphadenopathy.  Reproductive: Uterus and ovaries are normal.  Musculoskeletal: Acute findings of the skeleton. There several sclerotic Schmorl's nodes in the thoracic and lumbar endplates.  Other: No free fluid.  IMPRESSION: 1. Multiple large gallstones without clear evidence of acute cholecystitis. In patient with right upper flank pain recommend evaluation cholecystitis. If clinical evaluation equivocal, consider nuclear medicine HIDA scan. 2. No nephrolithiasis or ureterolithiasis.  No obstructive uropathy.   Electronically Signed   By: Genevive Bi M.D.   On: 06/26/2014 13:01   US Abdomen Limited Ruq  06/26/2014   CLINICAL DATA:  Right upper abdominal and flank pain  EXAM: US ABDOMEN LIMITED - RIGHT UPPER QUADRANT  COMPARISON:  CT abdomen and pelvis June 26, 2014  FINDINGS: Gallbladder:  Within the gallbladder, there are several echogenic foci which move and shadow consistent with gallstones. The largest gallstone measures 2.3 cm in length. There is a 1.8 cm gallstone which appears adherent in the region of the neck of the gallbladder. There is no gallbladder wall thickening or pericholecystic fluid. No sonographic Murphy sign noted.  Common bile duct:  Diameter: 4 mm. There is no intrahepatic or extrahepatic biliary duct dilatation.  Liver:  No focal lesion identified. Within normal limits in parenchymal echogenicity.  IMPRESSION: Cholelithiasis. Note that there is a calculus which appears adherent in the neck of the gallbladder.  If there is concern for cystic duct obstruction, nuclear medicine hepatobiliary study could be helpful to further evaluate. Study otherwise unremarkable.   Electronically Signed   By: Bretta Bang III M.D.   On: 06/26/2014 14:13     Anti-infectives: Anti-infectives    Start     Dose/Rate Route Frequency Ordered Stop   06/27/14 1245  cefTRIAXone (ROCEPHIN) 1 g in dextrose 5 % 50 mL IVPB - Premix  Status:  Discontinued     1 g 100 mL/hr over 30 Minutes Intravenous To Surgery 06/27/14 1236 06/27/14 1240   06/27/14 1245  cefTRIAXone (ROCEPHIN) 2 g in dextrose 5 % 50 mL IVPB - Premix     2 g 100 mL/hr over 30 Minutes Intravenous To Surgery 06/27/14 1240 06/27/14 1303   06/26/14 1600  cefTRIAXone (ROCEPHIN) 2 g in dextrose 5 % 50 mL IVPB     2 g 100 mL/hr over 30 Minutes Intravenous Every 24 hours 06/26/14 1548        Assessment/Plan: s/p Procedure(s): LAPAROSCOPIC CHOLECYSTECTOMY  Doing well postoperatively without complication. Okay for discharge.   LOS: 2 days    Beverlyann Broxterman T 06/28/2014

## 2014-06-28 NOTE — Discharge Summary (Signed)
   Patient ID: Smith Arms [id number] 23 y.o. 08/12/1990  06/26/2014  Discharge date and time: 06/28/2014   Admitting Physician: Emelia Loron  Discharge Physician: Glenna Fellows T  Admission Diagnoses: Biliary colic [K80.50] Abdominal pain [R10.9]  Discharge Diagnoses: Acute cholecystitis and cholelithiasis  Operations: Procedure(s): LAPAROSCOPIC CHOLECYSTECTOMY Dr. Violeta Gelinas  Admission Condition: fair  Discharged Condition: good  Indication for Admission: Patient presented with acute persistent severe epigastric pain. Workup included a gallbladder ultrasound showing cholelithiasis. Her pain persisted despite treatment with localized tenderness noted on exam. She was admitted for urgent cholecystectomy  Hospital Course: Patient underwent an uneventful laparoscopic cholecystectomy and cholangiogram with findings of acute cholecystitis. The following morning her pain is relieved. Having just incisional soreness. Tolerating diet without nausea. Up and ambulatory. Felt ready for discharge.  Disposition: Home  Patient Instructions:    Medication List    STOP taking these medications        cyclobenzaprine 10 MG tablet  Commonly known as:  FLEXERIL     oxyCODONE-acetaminophen 5-325 MG per tablet  Commonly known as:  PERCOCET      TAKE these medications        HYDROcodone-acetaminophen 5-325 MG per tablet  Commonly known as:  NORCO/VICODIN  Take 1-2 tablets by mouth every 4 (four) hours as needed for moderate pain.        Activity: no heavy lifting for 2 weeks Diet: regular diet Wound Care: none needed  Follow-up:  With Dr. Janee Morn in 2 weeks.  Signed: Mariella Saa MD, FACS  06/28/2014, 9:17 AM

## 2014-06-28 NOTE — Discharge Instructions (Signed)
[facility] ______[facility] LAPAROSCOPIC SURGERY: POST OP INSTRUCTIONS  No work until 07/14/2014 Patient's family was required to be with her on 06/27/2014 due to illness and surgery  Always review your discharge instruction sheet given to you by the facility where your surgery was performed. IF YOU HAVE DISABILITY OR FAMILY LEAVE FORMS, YOU MUST BRING THEM TO THE OFFICE FOR PROCESSING.   DO NOT GIVE THEM TO YOUR DOCTOR.  1. A prescription for pain medication may be given to you upon discharge.  Take your pain medication as prescribed, if needed.  If narcotic pain medicine is not needed, then you may take acetaminophen (Tylenol) or ibuprofen (Advil) as needed. 2. Take your usually prescribed medications unless otherwise directed. 3. If you need a refill on your pain medication, please contact your pharmacy.  They will contact our office to request authorization. Prescriptions will not be filled after 5pm or on week-ends. 4. You should follow a light diet the first few days after arrival home, such as soup and crackers, etc.  Be sure to include lots of fluids daily. 5. Most patients will experience some swelling and bruising in the area of the incisions.  Ice packs will help.  Swelling and bruising can take several days to resolve.  6. It is common to experience some constipation if taking pain medication after surgery.  Increasing fluid intake and taking a stool softener (such as Colace) will usually help or prevent this problem from occurring.  A mild laxative (Milk of Magnesia or Miralax) should be taken according to package instructions if there are no bowel movements after 48 hours. 7. Unless discharge instructions indicate otherwise, you may remove your bandages 24-48 hours after surgery, and you may shower at that time.  You may have steri-strips (small skin tapes) in place directly over the incision.  These strips should be left on the skin for 7-10 days.  If your surgeon used  skin glue on the incision, you may shower in 24 hours.  The glue will flake off over the next 2-3 weeks.  Any sutures or staples will be removed at the office during your follow-up visit. 8. ACTIVITIES:  You may resume regular (light) daily activities beginning the next day--such as daily self-care, walking, climbing stairs--gradually increasing activities as tolerated.  You may have sexual intercourse when it is comfortable.  Refrain from any heavy lifting or straining until approved by your doctor. a. You may drive when you are no longer taking prescription pain medication, you can comfortably wear a seatbelt, and you can safely maneuver your car and apply brakes. b. RETURN TO WORK:  __________________________________________________________ 9. You should see your doctor in the office for a follow-up appointment approximately 2-3 weeks after your surgery.  Make sure that you call for this appointment within a day or two after you arrive home to insure a convenient appointment time. 10. OTHER INSTRUCTIONS: __________________________________________________________________________________________________________________________ __________________________________________________________________________________________________________________________ WHEN TO CALL YOUR DOCTOR: 1. Fever over 101.0 2. Inability to urinate 3. Continued bleeding from incision. 4. Increased pain, redness, or drainage from the incision. 5. Increasing abdominal pain  The clinic staff is available to answer your questions during regular business hours.  Please dont hesitate to call and ask to speak to one of the nurses for clinical concerns.  If you have a medical emergency, go to the nearest emergency room or call 911.  A surgeon from [facility] is always on call at the hospital. [address], [place], [place]  [address] ? [address] [address], [place], [place]   [address] [phone] ? [phone] ?  FAX [phone] Web site: [email or web address] ______[facility] LAPAROSCOPIC SURGERY: POST OP INSTRUCTIONS Always review your discharge instruction sheet given to you by the facility where your surgery was performed. IF YOU HAVE DISABILITY OR FAMILY LEAVE FORMS, YOU MUST BRING THEM TO THE OFFICE FOR PROCESSING.   DO NOT GIVE THEM TO YOUR DOCTOR.  11. A prescription for pain medication may be given to you upon discharge.  Take your pain medication as prescribed, if needed.  If narcotic pain medicine is not needed, then you may take acetaminophen (Tylenol) or ibuprofen (Advil) as needed. 12. Take your usually prescribed medications unless otherwise directed. 13. If you need a refill on your pain medication, please contact your pharmacy.  They will contact our office to request authorization. Prescriptions will not be filled after 5pm or on week-ends. 14. You should follow a light diet the first few days after arrival home, such as soup and crackers, etc.  Be sure to include lots of fluids daily. 15. Most patients will experience some swelling and bruising in the area of the incisions.  Ice packs will help.  Swelling and bruising can take several days to resolve.  16. It is common to experience some constipation if taking pain medication after surgery.  Increasing fluid intake and taking a stool softener (such as Colace) will usually help or prevent this problem from occurring.  A mild laxative (Milk of Magnesia or Miralax) should be taken according to package instructions if there are no bowel movements after 48 hours. 17. Unless discharge instructions indicate otherwise, you may remove your bandages 24-48 hours after surgery, and you may shower at that time.  You may have steri-strips (small skin tapes) in place directly over the incision.  These strips should be left on the skin for 7-10 days.  If your surgeon used skin glue on the incision, you may shower in 24  hours.  The glue will flake off over the next 2-3 weeks.  Any sutures or staples will be removed at the office during your follow-up visit. 18. ACTIVITIES:  You may resume regular (light) daily activities beginning the next day--such as daily self-care, walking, climbing stairs--gradually increasing activities as tolerated.  You may have sexual intercourse when it is comfortable.  Refrain from any heavy lifting or straining until approved by your doctor. a. You may drive when you are no longer taking prescription pain medication, you can comfortably wear a seatbelt, and you can safely maneuver your car and apply brakes. b. RETURN TO WORK:  _07/05/2016_________________________________________________________ 19. You should see your doctor in the office for a follow-up appointment approximately 2-3 weeks after your surgery.  Make sure that you call for this appointment within a day or two after you arrive home to insure a convenient appointment time. 20. OTHER INSTRUCTIONS: __________________________________________________________________________________________________________________________ __________________________________________________________________________________________________________________________ WHEN TO CALL YOUR DOCTOR: 6. Fever over 101.0 7. Inability to urinate 8. Continued bleeding from incision. 9. Increased pain, redness, or drainage from the incision. 10. Increasing abdominal pain  The clinic staff is available to answer your questions during regular business hours.  Please dont hesitate to call and ask to speak to one of the nurses for clinical concerns.  If you have a medical emergency, go to the nearest emergency room or call 911.  A surgeon from [facility] is always on call at the hospital. [address], [address], [place], [place]  [address] ? [address] [address], [place], [place]   [address] [phone] ? [phone] ? FAX [phone] Web site:  [email or web address]

## 2014-06-28 NOTE — Progress Notes (Signed)
Verbally understood DC instructions, no questions asked. 

## 2014-06-29 ENCOUNTER — Encounter (HOSPITAL_COMMUNITY): Payer: Self-pay | Admitting: General Surgery

## 2014-10-11 DIAGNOSIS — E669 Obesity, unspecified: Secondary | ICD-10-CM | POA: Insufficient documentation

## 2014-10-11 DIAGNOSIS — L680 Hirsutism: Secondary | ICD-10-CM | POA: Insufficient documentation

## 2014-10-11 DIAGNOSIS — E559 Vitamin D deficiency, unspecified: Secondary | ICD-10-CM | POA: Insufficient documentation

## 2015-04-20 DIAGNOSIS — N926 Irregular menstruation, unspecified: Secondary | ICD-10-CM | POA: Insufficient documentation

## 2016-04-28 ENCOUNTER — Ambulatory Visit (INDEPENDENT_AMBULATORY_CARE_PROVIDER_SITE_OTHER): Payer: BLUE CROSS/BLUE SHIELD | Admitting: Physician Assistant

## 2016-04-28 ENCOUNTER — Encounter: Payer: Self-pay | Admitting: Physician Assistant

## 2016-04-28 VITALS — BP 110/80 | HR 65 | Temp 98.4°F | Resp 14 | Ht 68.0 in | Wt 233.0 lb

## 2016-04-28 DIAGNOSIS — E282 Polycystic ovarian syndrome: Secondary | ICD-10-CM

## 2016-04-28 DIAGNOSIS — Z3009 Encounter for other general counseling and advice on contraception: Secondary | ICD-10-CM | POA: Diagnosis not present

## 2016-04-28 LAB — POCT URINE PREGNANCY: PREG TEST UR: NEGATIVE

## 2016-04-28 MED ORDER — NORETHIN ACE-ETH ESTRAD-FE 1-20 MG-MCG PO TABS: 1.0000 | ORAL_TABLET | Freq: Every day | ORAL | 11 refills | Status: DC

## 2016-04-28 NOTE — Patient Instructions (Signed)
I am going to be reviewing your Endo records so we can get an idea if a diagnosis was truly made and next steps regarding care.   I will be calling you early next week so we can discuss.   Please start the birth control taking as directed.   We will schedule follow-up after we discussed next steps.

## 2016-04-28 NOTE — Progress Notes (Signed)
Patient presents to clinic today to establish care.  Acute Concerns: Patient requesting to start OCPs. She is soon moving in with her boyfriend. They are sexually active and do use condoms but she wants to be extra careful. Has taken OCPs in the past and tolerated well.   Menrche -- 8 to 9th grade. Irregular periods since then then. 3 day cycles now.   Chronic Issues: PCOS -- Previously assessed by former PCP and Endocrinology with significant workup. Patient had menarche in 9th grade. Notes irregular periods since that time. Has had issue with weight loss and with facial hair. EMR review shows significant workup from Endo that was unremarkable for other cause of hirsutism. Was to be started on Spironolactone for hirsutism, but it seems patient never received medication. Patient states she has had electrolysis for facial hair with little improvement. Would like to start the spironolactone.     Past Medical History:  Diagnosis Date  . Anxiety   . History of chickenpox   . PCOS (polycystic ovarian syndrome)     Past Surgical History:  Procedure Laterality Date  . CHOLECYSTECTOMY N/A 06/27/2014   Procedure: LAPAROSCOPIC CHOLECYSTECTOMY ;  Surgeon: Violeta Gelinas, MD;  Location: [facility] OR;  Service: General;  Laterality: N/A;    No current outpatient prescriptions on file prior to visit.   No current facility-administered medications on file prior to visit.     No Known Allergies  Family History  Problem Relation Age of Onset  . Family history unknown: Yes    Social History   Social History  . Marital status: Single    Spouse name: N/A  . Number of children: 0  . Years of education: N/A   Occupational History  . Not on file.   Social History Main Topics  . Smoking status: Never Smoker  . Smokeless tobacco: Never Used  . Alcohol use No  . Drug use: No  . Sexual activity: Yes    Partners: Male    Birth control/ protection: Condom   Other Topics Concern  . Not on file    Social History Narrative  . No narrative on file   Review of Systems  Constitutional: Negative for fever and weight loss.  HENT: Negative for ear discharge, ear pain, hearing loss and tinnitus.   Eyes: Negative for blurred vision, double vision, photophobia and pain.  Respiratory: Negative for cough and shortness of breath.   Cardiovascular: Negative for chest pain and palpitations.  Gastrointestinal: Negative for abdominal pain, blood in stool, constipation, diarrhea, heartburn, melena, nausea and vomiting.  Genitourinary: Negative for dysuria, flank pain, frequency, hematuria and urgency.  Musculoskeletal: Negative for falls.  Neurological: Negative for dizziness, loss of consciousness and headaches.  Endo/Heme/Allergies: Negative for environmental allergies.  Psychiatric/Behavioral: Negative for depression, hallucinations, substance abuse and suicidal ideas. The patient is not nervous/anxious and does not have insomnia.    BP 110/80   Pulse 65   Temp 98.4 F (36.9 C) (Oral)   Resp 14   Ht  (1.727 m)   Wt 233 lb (105.7 kg)   SpO2 95%   BMI 35.43 kg/m   Physical Exam  Constitutional: She is oriented to person, place, and time and well-developed, well-nourished, and in no distress.  HENT:  Head: Normocephalic and atraumatic.  Right Ear: External ear normal.  Left Ear: External ear normal.  Nose: Nose normal.  Mouth/Throat: Oropharynx is clear and moist. No oropharyngeal exudate.  Eyes: Conjunctivae are normal. Pupils are equal, round, and  reactive to light.  Neck: Neck supple. No thyromegaly present.  Cardiovascular: Normal rate, regular rhythm, normal heart sounds and intact distal pulses.   Pulmonary/Chest: Effort normal and breath sounds normal. No respiratory distress. She has no wheezes. She has no rales. She exhibits no tenderness.  Lymphadenopathy:    She has no cervical adenopathy.  Neurological: She is alert and oriented to person, place, and time. No  cranial nerve deficit.  Skin: Skin is warm and dry.  Facial hirsutism noted on examination.  Psychiatric: Affect normal.  Vitals reviewed.  Assessment/Plan: 1. General counseling and advice on female contraception Urine pregnancy negative. Will start Loestrin to give contraception and to help with PCOS. Follow-up scheduled. - POCT urine pregnancy  2. PCOS (polycystic ovarian syndrome) Reviewed EMR -- significant prior workup. I can see where Endo had sent in Spironolactone but there was some miscommunication so medication was never started. I have no objection to starting but will start at 25 mg dose. FU scheduled to assess BP and BMP before further titration of medication and to assess for insulin resistance.   Piedad Climes, PA-C

## 2016-04-28 NOTE — Progress Notes (Signed)
Pre visit review using our clinic review tool, if applicable. No additional management support is needed unless otherwise documented below in the visit note. 

## 2016-05-03 ENCOUNTER — Telehealth: Payer: Self-pay | Admitting: Physician Assistant

## 2016-05-03 MED ORDER — SPIRONOLACTONE 25 MG PO TABS: 25.0000 mg | ORAL_TABLET | Freq: Every day | ORAL | 1 refills | Status: DC

## 2016-05-03 NOTE — Telephone Encounter (Signed)
Attempted to reach patient concerning reviewing her old records. No answer. LMOVM for callback.   I am ok starting the spironolactone for hirsutism giving her significant prior workup. I have sent in a 25 mg dose to start with. Want to make sure she tolerates well and has no side effect before increasing. Have her follow-up with me in 1 week for repeat BP check on medication and to discuss further adjustment.

## 2016-05-04 DIAGNOSIS — E282 Polycystic ovarian syndrome: Secondary | ICD-10-CM | POA: Insufficient documentation

## 2016-05-05 NOTE — Telephone Encounter (Signed)
Patient advised of starting the Spironolactone. Scheduled a follow up in 1 week to check bp while on the medication. She is agreeable.

## 2016-05-11 ENCOUNTER — Encounter: Payer: Self-pay | Admitting: Physician Assistant

## 2016-05-11 ENCOUNTER — Ambulatory Visit (INDEPENDENT_AMBULATORY_CARE_PROVIDER_SITE_OTHER): Payer: BLUE CROSS/BLUE SHIELD | Admitting: Physician Assistant

## 2016-05-11 ENCOUNTER — Other Ambulatory Visit (HOSPITAL_COMMUNITY)
Admission: RE | Admit: 2016-05-11 | Discharge: 2016-05-11 | Disposition: A | Discharge status: Home / Self Care | Payer: BLUE CROSS/BLUE SHIELD | Source: Ambulatory Visit | Attending: Physician Assistant | Admitting: Physician Assistant

## 2016-05-11 VITALS — BP 102/68 | HR 68 | Temp 98.4°F | Resp 14 | Ht 68.0 in | Wt 231.0 lb

## 2016-05-11 DIAGNOSIS — E282 Polycystic ovarian syndrome: Secondary | ICD-10-CM

## 2016-05-11 DIAGNOSIS — L680 Hirsutism: Secondary | ICD-10-CM

## 2016-05-11 DIAGNOSIS — N898 Other specified noninflammatory disorders of vagina: Secondary | ICD-10-CM | POA: Insufficient documentation

## 2016-05-11 LAB — COMPREHENSIVE METABOLIC PANEL
ALT: 38 U/L — AB (ref 0–35)
AST: 19 U/L (ref 0–37)
Albumin: 4.1 g/dL (ref 3.5–5.2)
Alkaline Phosphatase: 55 U/L (ref 39–117)
BUN: 10 mg/dL (ref 6–23)
CO2: 28 meq/L (ref 19–32)
Calcium: 9 mg/dL (ref 8.4–10.5)
Chloride: 104 mEq/L (ref 96–112)
Creatinine, Ser: 0.78 mg/dL (ref 0.40–1.20)
GFR: 95 mL/min (ref >60.00)
Glucose, Bld: 90 mg/dL (ref 70–99)
POTASSIUM: 4.1 meq/L (ref 3.5–5.1)
Sodium: 138 mEq/L (ref 135–145)
Total Bilirubin: 0.7 mg/dL (ref 0.2–1.2)
Total Protein: 7 g/dL (ref 6.0–8.3)

## 2016-05-11 LAB — POCT URINALYSIS DIPSTICK
Bilirubin, UA: NEGATIVE
Glucose, UA: NEGATIVE
Ketones, UA: NEGATIVE
Nitrite, UA: NEGATIVE
PROTEIN UA: NEGATIVE
Spec Grav, UA: 1.025 (ref 1.010–1.025)
Urobilinogen, UA: 0.2 E.U./dL
pH, UA: 5 (ref 5.0–8.0)

## 2016-05-11 LAB — HEMOGLOBIN A1C: Hgb A1c MFr Bld: 5.8 % (ref 4.6–6.5)

## 2016-05-11 NOTE — Progress Notes (Signed)
Pre visit review using our clinic review tool, if applicable. No additional management support is needed unless otherwise documented below in the visit note. 

## 2016-05-11 NOTE — Patient Instructions (Signed)
Please go to the lab for blood work. I will call you with your results.  For now, continue the current medication regimen.  I am not increasing as I do not want blood pressure to get lower.  If all is good on labs, we will follow-up in 3 months.    Polycystic Ovarian Syndrome Polycystic ovarian syndrome (PCOS) is a common hormonal disorder among women of reproductive age. In most women with PCOS, many small fluid-filled sacs (cysts) grow on the ovaries, and the cysts are not part of a normal menstrual cycle. PCOS can cause problems with your menstrual periods and make it difficult to get pregnant. It can also cause an increased risk of miscarriage with pregnancy. If it is not treated, PCOS can lead to serious health problems, such as diabetes and heart disease. What are the causes? The cause of PCOS is not known, but it may be the result of a combination of certain factors, such as:  Irregular menstrual cycle.  High levels of certain hormones (androgens).  Problems with the hormone that helps to control blood sugar (insulin resistance).  Certain genes. What increases the risk? This condition is more likely to develop in women who have a family history of PCOS. What are the signs or symptoms? Symptoms of PCOS may include:  Multiple ovarian cysts.  Infrequent periods or no periods.  Periods that are too frequent or too heavy.  Unpredictable periods.  Inability to get pregnant (infertility) because of not ovulating.  Increased growth of hair on the face, chest, stomach, back, thumbs, thighs, or toes.  Acne or oily skin. Acne may develop during adulthood, and it may not respond to treatment.  Pelvic pain.  Weight gain or obesity.  Patches of thickened and dark brown or black skin on the neck, arms, breasts, or thighs (acanthosis nigricans).  Excess hair growth on the face, chest, abdomen, or upper thighs (hirsutism). How is this diagnosed? This condition is diagnosed based  on:  Your medical history.  A physical exam, including a pelvic exam. Your health care provider may look for areas of increased hair growth on your skin.  Tests, such as:  Ultrasound. This may be used to examine the ovaries and the lining of the uterus (endometrium) for cysts.  Blood tests. These may be used to check levels of sugar (glucose), female hormone (testosterone), and female hormones (estrogen and progesterone) in your blood. How is this treated? There is no cure for PCOS, but treatment can help to manage symptoms and prevent more health problems from developing. Treatment varies depending on:  Your symptoms.  Whether you want to have a baby or whether you need birth control (contraception). Treatment may include nutrition and lifestyle changes along with:  Progesterone hormone to start a menstrual period.  Birth control pills to help you have regular menstrual periods.  Medicines to make you ovulate, if you want to get pregnant.  Medicine to reduce excessive hair growth.  Surgery, in severe cases. This may involve making small holes in one or both of your ovaries. This decreases the amount of testosterone that your body produces. Follow these instructions at home:  Take over-the-counter and prescription medicines only as told by your health care provider.  Follow a healthy meal plan. This can help you reduce the effects of PCOS.  Eat a healthy diet that includes lean proteins, complex carbohydrates, fresh fruits and vegetables, low-fat dairy products, and healthy fats. Make sure to eat enough fiber.  If you are overweight,  lose weight as told by your health care provider.  Losing 10% of your body weight may improve symptoms.  Your health care provider can determine how much weight loss is best for you and can help you lose weight safely.  Keep all follow-up visits as told by your health care provider. This is important. Contact a health care provider if:  Your  symptoms do not get better with medicine.  You develop new symptoms. This information is not intended to replace advice given to you by your health care provider. Make sure you discuss any questions you have with your health care provider. Document Released: 04/21/2004 Document Revised: 08/24/2015 Document Reviewed: 06/13/2015 Elsevier Interactive Patient Education  2017 Elsevier Inc.   Diet for Polycystic Ovarian Syndrome Polycystic ovary syndrome (PCOS) is a disorder of the chemical messengers (hormones) that regulate menstruation. The condition causes important hormones to be out of balance. PCOS can:  Make your periods irregular or stop.  Cause cysts to develop on the ovaries.  Make it difficult to get pregnant.  Stop your body from responding to the effects of insulin (insulin resistance), which can lead to obesity and diabetes. Changing what you eat can help manage PCOS and improve your health. It can help you lose weight and improve the way your body uses insulin. What is my plan?  Eat breakfast, lunch, and dinner plus two snacks every day.  Include protein in each meal and snack.  Choose whole grains instead of products made with refined flour.  Eat a variety of foods.  Exercise regularly as told by your health care provider. What do I need to know about this eating plan? If you are overweight or obese, pay attention to how many calories you eat. Cutting down on calories can help you lose weight. Work with your health care provider or dietitian to figure out how many calories you need each day. What foods can I eat? Grains  Whole grains, such as whole wheat. Whole-grain breads, crackers, cereals, and pasta. Unsweetened oatmeal, bulgur, barley, quinoa, or brown rice. Corn or whole-wheat flour tortillas. Vegetables   Lettuce. Spinach. Peas. Beets. Cauliflower. Cabbage. Broccoli. Carrots. Tomatoes. Squash. Eggplant. Herbs. Peppers. Onions. Cucumbers. Brussels  sprouts. Fruits  Berries. Bananas. Apples. Oranges. Grapes. Papaya. Mango. Pomegranate. Kiwi. Grapefruit. Cherries. Meats and Other Protein Sources  Lean proteins, such as fish, chicken, beans, eggs, and tofu. Dairy  Low-fat dairy products, such as skim milk, cheese sticks, and yogurt. Beverages  Low-fat or fat-free drinks, such as water, low-fat milk, sugar-free drinks, and 100% fruit juice. Condiments  Ketchup. Mustard. Barbecue sauce. Relish. Low-fat or fat-free mayonnaise. Fats and Oils  Olive oil or canola oil. Walnuts and almonds. The items listed above may not be a complete list of recommended foods or beverages. Contact your dietitian for more options.  What foods are not recommended? Foods high in calories or fat. Fried foods. Sweets. Products made from refined white flour, including white bread, pastries, white rice, and pasta. The items listed above may not be a complete list of foods and beverages to avoid. Contact your dietitian for more information.  This information is not intended to replace advice given to you by your health care provider. Make sure you discuss any questions you have with your health care provider. Document Released: 04/19/2015 Document Revised: 06/03/2015 Document Reviewed: 01/07/2014 Elsevier Interactive Patient Education  2017 [employer]

## 2016-05-11 NOTE — Progress Notes (Signed)
   Patient presents to clinic today for follow-up of hirsutism secondary to PCOS after starting spironolactone. Patient was started on Spironolactone 25 mg daily. Is taking as directed. Denies side effect. Denies lightheadedness or dizziness.   BP Readings from Last 3 Encounters:  04/28/16 110/80  06/28/14 112/62  09/28/12 130/64    Past Medical History:  Diagnosis Date  . Anxiety   . History of chickenpox   . PCOS (polycystic ovarian syndrome)     Current Outpatient Prescriptions on File Prior to Visit  Medication Sig Dispense Refill  . norethindrone-ethinyl estradiol (LOESTRIN FE 1/20) 1-20 MG-MCG tablet Take 1 tablet by mouth daily. 1 Package 11  . spironolactone (ALDACTONE) 25 MG tablet Take 1 tablet (25 mg total) by mouth daily. 30 tablet 1   No current facility-administered medications on file prior to visit.     No Known Allergies  Family History  Problem Relation Age of Onset  . Family history unknown: Yes    Social History   Social History  . Marital status: Single    Spouse name: N/A  . Number of children: 0  . Years of education: N/A   Social History Main Topics  . Smoking status: Never Smoker  . Smokeless tobacco: Never Used  . Alcohol use No  . Drug use: No  . Sexual activity: Yes    Partners: Male    Birth control/ protection: Condom   Other Topics Concern  . None   Social History Narrative  . None   Review of Systems - See HPI.  All other ROS are negative.  There were no vitals taken for this visit.  Physical Exam  Constitutional: She is oriented to person, place, and time and well-developed, well-nourished, and in no distress.  HENT:  Head: Normocephalic and atraumatic.  Eyes: Conjunctivae are normal.  Cardiovascular: Normal rate, regular rhythm, normal heart sounds and intact distal pulses.   Pulmonary/Chest: Effort normal.  Neurological: She is alert and oriented to person, place, and time.  Skin: Skin is warm and dry. No rash noted.   Psychiatric: Affect normal.  Vitals reviewed.  Recent Results (from the past 2160 hour(s))  POCT urine pregnancy     Status: Normal   Collection Time: 04/28/16  3:27 PM  Result Value Ref Range   Preg Test, Ur Negative Negative    Assessment/Plan: 1. Female hirsutism Secondary to # 2.   2. PCOS (polycystic ovarian syndrome) Continue Spironolactone at current dose. Continue OCP. Will check CMP to assess potassium level. A1C to assess for insulin resistance. - Comp Met (CMET) - Hemoglobin A1c  3. Vaginal irritation Noted at end of visit. Declines examination. Denies discharge, recent intercourse or lesion. Denies urinary symptoms. Thinks may be related to changing TP and soaps. Urine dip with LE and trace blood. Due for menstrual period. Is on OCPs. Will check Urine culture and ancillary studies. Supportive measures reviewed. Will treat based on results.   - POCT Urinalysis Dipstick - Urine cytology ancillary only - Urine culture   Leeanne Rio, PA-C

## 2016-05-12 LAB — URINE CULTURE

## 2016-05-17 LAB — URINE CYTOLOGY ANCILLARY ONLY: CANDIDA VAGINITIS: NEGATIVE

## 2016-05-18 ENCOUNTER — Other Ambulatory Visit: Payer: Self-pay | Admitting: Physician Assistant

## 2016-05-18 MED ORDER — METRONIDAZOLE 500 MG PO TABS: 500.0000 mg | ORAL_TABLET | Freq: Two times a day (BID) | ORAL | 0 refills | Status: DC

## 2016-06-07 ENCOUNTER — Telehealth: Payer: Self-pay | Admitting: Physician Assistant

## 2016-06-07 NOTE — Telephone Encounter (Signed)
Pt would like a call back, she would not tell me what she needed other than she has a question to ask. I tried to find out what that question was to help with the call back and she would not tell me.

## 2016-06-07 NOTE — Telephone Encounter (Signed)
LMOVM for call back for questions.

## 2016-06-09 NOTE — Telephone Encounter (Addendum)
Spoke with patient and she advised that she took a home pregnancy test which was positive. She went to the urgent care [facility] and had a blood test hcg which was positive as well.  She wanted to know if medication could cause false positive test.  Per PCP usually the medication doesn't. Advised patient to stop the BCP and Spironolactone. Patient will bring a copy of lab results and will proceed with referral to GYN.

## 2016-06-22 LAB — OB RESULTS CONSOLE HIV ANTIBODY (ROUTINE TESTING): HIV: NONREACTIVE

## 2016-06-22 LAB — OB RESULTS CONSOLE RPR: RPR: NONREACTIVE

## 2016-06-23 LAB — OB RESULTS CONSOLE GC/CHLAMYDIA
Chlamydia: NEGATIVE
GC PROBE AMP, GENITAL: NEGATIVE

## 2016-06-23 LAB — OB RESULTS CONSOLE HGB/HCT, BLOOD
HCT: 41
Hemoglobin: 12

## 2016-06-23 LAB — OB RESULTS CONSOLE PLATELET COUNT: PLATELETS: 290

## 2016-06-23 LAB — OB RESULTS CONSOLE ABO/RH: RH TYPE: POSITIVE

## 2016-06-23 LAB — SICKLE CELL SCREEN: Sickle Cell Screen: NEGATIVE

## 2016-06-23 LAB — OB RESULTS CONSOLE HEPATITIS B SURFACE ANTIGEN: Hepatitis B Surface Ag: NEGATIVE

## 2016-07-13 DIAGNOSIS — Z3689 Encounter for other specified antenatal screening: Secondary | ICD-10-CM | POA: Insufficient documentation

## 2016-10-26 DIAGNOSIS — O99213 Obesity complicating pregnancy, third trimester: Secondary | ICD-10-CM | POA: Insufficient documentation

## 2016-10-26 LAB — OB RESULTS CONSOLE RPR: RPR: NONREACTIVE

## 2016-10-26 LAB — OB RESULTS CONSOLE HIV ANTIBODY (ROUTINE TESTING): HIV: NONREACTIVE

## 2016-12-08 ENCOUNTER — Encounter: Payer: BLUE CROSS/BLUE SHIELD | Admitting: Family

## 2016-12-18 ENCOUNTER — Encounter: Payer: Self-pay | Admitting: Obstetrics & Gynecology

## 2016-12-19 ENCOUNTER — Encounter: Payer: Self-pay | Admitting: Advanced Practice Midwife

## 2016-12-21 ENCOUNTER — Encounter: Payer: Self-pay | Admitting: Obstetrics & Gynecology

## 2016-12-26 ENCOUNTER — Ambulatory Visit (INDEPENDENT_AMBULATORY_CARE_PROVIDER_SITE_OTHER): Payer: BLUE CROSS/BLUE SHIELD | Admitting: Advanced Practice Midwife

## 2016-12-26 ENCOUNTER — Encounter: Payer: Self-pay | Admitting: Advanced Practice Midwife

## 2016-12-26 VITALS — BP 116/67 | HR 57 | Wt 270.0 lb

## 2016-12-26 DIAGNOSIS — Z3490 Encounter for supervision of normal pregnancy, unspecified, unspecified trimester: Secondary | ICD-10-CM | POA: Insufficient documentation

## 2016-12-26 DIAGNOSIS — Z3403 Encounter for supervision of normal first pregnancy, third trimester: Secondary | ICD-10-CM | POA: Diagnosis not present

## 2016-12-26 DIAGNOSIS — O365990 Maternal care for other known or suspected poor fetal growth, unspecified trimester, not applicable or unspecified: Secondary | ICD-10-CM | POA: Diagnosis not present
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
## 2016-12-26 NOTE — Patient Instructions (Signed)

## 2016-12-26 NOTE — Progress Notes (Signed)
Patient has been seen at [facility]. Last appt 12-21-16. Armandina StammerJennifer Rayburn Mundis RNBSN  Patient mentions that last ultrasound stated the babys abdomen was small, but reports no other problems this pregnancy. Early 1 hr gtt: 97 1 hour gtt:84 Patient reports she got aTDAP with this pregnancy. Armandina StammerJennifer Nashika Coker RNBSN

## 2017-01-05 ENCOUNTER — Ambulatory Visit (INDEPENDENT_AMBULATORY_CARE_PROVIDER_SITE_OTHER): Payer: BLUE CROSS/BLUE SHIELD | Admitting: Obstetrics & Gynecology

## 2017-01-05 VITALS — BP 121/71 | HR 71 | Wt 269.0 lb

## 2017-01-05 DIAGNOSIS — R21 Rash and other nonspecific skin eruption: Secondary | ICD-10-CM | POA: Diagnosis not present

## 2017-01-05 DIAGNOSIS — R9389 Abnormal findings on diagnostic imaging of other specified body structures: Secondary | ICD-10-CM | POA: Diagnosis not present

## 2017-01-05 DIAGNOSIS — Z3490 Encounter for supervision of normal pregnancy, unspecified, unspecified trimester: Secondary | ICD-10-CM

## 2017-01-05 DIAGNOSIS — Z3A37 37 weeks gestation of pregnancy: Secondary | ICD-10-CM
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
## 2017-01-07 LAB — BILE ACIDS, TOTAL: BILE ACIDS TOTAL: 8.7 umol/L (ref 4.7–24.5)

## 2017-01-08 ENCOUNTER — Telehealth: Payer: Self-pay | Admitting: *Deleted

## 2017-01-08 ENCOUNTER — Inpatient Hospital Stay (HOSPITAL_COMMUNITY)
Admission: AD | Admit: 2017-01-08 | Discharge: 2017-01-08 | Disposition: A | Discharge status: Home / Self Care | Payer: BLUE CROSS/BLUE SHIELD | Source: Ambulatory Visit | Attending: Obstetrics & Gynecology | Admitting: Obstetrics & Gynecology

## 2017-01-08 ENCOUNTER — Encounter (HOSPITAL_COMMUNITY): Payer: Self-pay

## 2017-01-08 ENCOUNTER — Encounter (HOSPITAL_COMMUNITY): Payer: Self-pay | Admitting: Emergency Medicine

## 2017-01-08 DIAGNOSIS — O4693 Antepartum hemorrhage, unspecified, third trimester: Secondary | ICD-10-CM

## 2017-01-08 DIAGNOSIS — Z3483 Encounter for supervision of other normal pregnancy, third trimester: Secondary | ICD-10-CM | POA: Diagnosis not present

## 2017-01-08 DIAGNOSIS — O163 Unspecified maternal hypertension, third trimester: Secondary | ICD-10-CM

## 2017-01-08 DIAGNOSIS — O134 Gestational [pregnancy-induced] hypertension without significant proteinuria, complicating childbirth: Secondary | ICD-10-CM | POA: Diagnosis not present

## 2017-01-08 DIAGNOSIS — O479 False labor, unspecified: Secondary | ICD-10-CM

## 2017-01-08 LAB — COMPREHENSIVE METABOLIC PANEL
ALK PHOS: 132 U/L — AB (ref 38–126)
ALT: 16 U/L (ref 14–54)
AST: 16 U/L (ref 15–41)
Albumin: 2.5 g/dL — ABNORMAL LOW (ref 3.5–5.0)
Anion gap: 10 (ref 5–15)
BILIRUBIN TOTAL: 0.6 mg/dL (ref 0.3–1.2)
BUN: 10 mg/dL (ref 6–20)
CHLORIDE: 101 mmol/L (ref 101–111)
CO2: 20 mmol/L — AB (ref 22–32)
CREATININE: 0.58 mg/dL (ref 0.44–1.00)
Calcium: 8.1 mg/dL — ABNORMAL LOW (ref 8.9–10.3)
GFR calc non Af Amer: >60 mL/min (ref >60)
GLUCOSE: 87 mg/dL (ref 65–99)
POTASSIUM: 3.5 mmol/L (ref 3.5–5.1)
SODIUM: 131 mmol/L — AB (ref 135–145)
Total Protein: 6.4 g/dL — ABNORMAL LOW (ref 6.5–8.1)

## 2017-01-08 LAB — CBC
HEMATOCRIT: 34 % — AB (ref 36.0–46.0)
HEMOGLOBIN: 11.1 g/dL — AB (ref 12.0–15.0)
MCH: 27.4 pg (ref 26.0–34.0)
MCHC: 32.6 g/dL (ref 30.0–36.0)
MCV: 84 fL (ref 78.0–100.0)
Platelets: 272 10*3/uL (ref 150–400)
RBC: 4.05 MIL/uL (ref 3.87–5.11)
RDW: 13.6 % (ref 11.5–15.5)
WBC: 14 10*3/uL — ABNORMAL HIGH (ref 4.0–10.5)

## 2017-01-08 LAB — URINALYSIS, ROUTINE W REFLEX MICROSCOPIC
Bilirubin Urine: NEGATIVE
GLUCOSE, UA: NEGATIVE mg/dL
KETONES UR: NEGATIVE mg/dL
NITRITE: NEGATIVE
PH: 6 (ref 5.0–8.0)
Protein, ur: NEGATIVE mg/dL
Specific Gravity, Urine: 1.025 (ref 1.005–1.030)

## 2017-01-08 LAB — PROTEIN / CREATININE RATIO, URINE
Creatinine, Urine: 196 mg/dL
Protein Creatinine Ratio: 0.09 mg/mg{Cre} (ref 0.00–0.15)
Total Protein, Urine: 17 mg/dL

## 2017-01-08 NOTE — MAU Note (Signed)
I have communicated with Dr. Rachelle HoraMoss and reviewed vital signs:  Vitals:   01/08/17 1209 01/08/17 1327  BP: 118/65 (!) 119/59  Pulse: 84 75  Resp: 19 16  Temp: 97.6 F (36.4 C)     Vaginal exam:  Dilation: 3 Effacement (%): 50 Cervical Position: Posterior Station: -3 Presentation: Vertex Exam by:: Ginnie Smartachel Morio Widen RN,   Also reviewed contraction pattern and that non-stress test is reactive.  It has been documented that patient is contracting every 5-6 minutes with no cervical change over 12 hours not indicating active labor.  Patient denies any other complaints.  Based on this report provider has given order for discharge.  A discharge order and diagnosis entered by a provider.   Labor discharge instructions reviewed with patient.

## 2017-01-08 NOTE — Discharge Instructions (Signed)
Your blood work was normal and also your blood pressures became normal as well.  Please return to Maternity Admissions Unit, if your bleeding increases to like a period; where you are soaking a pad every hour. The bleeding you are having now is called bloody show and is normal. You may see a little more just after leaving the hospital, because your cervix was checked twice. The bleeding should lighten up and become darker.

## 2017-01-08 NOTE — Discharge Instructions (Signed)
Braxton Hicks Contractions °Contractions of the uterus can occur throughout pregnancy, but they are not always a sign that you are in labor. You may have practice contractions called Braxton Hicks contractions. These false labor contractions are sometimes confused with true labor. °What are Braxton Hicks contractions? °Braxton Hicks contractions are tightening movements that occur in the muscles of the uterus before labor. Unlike true labor contractions, these contractions do not result in opening (dilation) and thinning of the cervix. Toward the end of pregnancy (32-34 weeks), Braxton Hicks contractions can happen more often and may become stronger. These contractions are sometimes difficult to tell apart from true labor because they can be very uncomfortable. You should not feel embarrassed if you go to the hospital with false labor. °Sometimes, the only way to tell if you are in true labor is for your health care provider to look for changes in the cervix. The health care provider will do a physical exam and may monitor your contractions. If you are not in true labor, the exam should show that your cervix is not dilating and your water has not broken. °If there are other health problems associated with your pregnancy, it is completely safe for you to be sent home with false labor. You may continue to have Braxton Hicks contractions until you go into true labor. °How to tell the difference between true labor and false labor °True labor °· Contractions last 30-70 seconds. °· Contractions become very regular. °· Discomfort is usually felt in the top of the uterus, and it spreads to the lower abdomen and low back. °· Contractions do not go away with walking. °· Contractions usually become more intense and increase in frequency. °· The cervix dilates and gets thinner. °False labor °· Contractions are usually shorter and not as strong as true labor contractions. °· Contractions are usually irregular. °· Contractions  are often felt in the front of the lower abdomen and in the groin. °· Contractions may go away when you walk around or change positions while lying down. °· Contractions get weaker and are shorter-lasting as time goes on. °· The cervix usually does not dilate or become thin. °Follow these instructions at home: °· Take over-the-counter and prescription medicines only as told by your health care provider. °· Keep up with your usual exercises and follow other instructions from your health care provider. °· Eat and drink lightly if you think you are going into labor. °· If Braxton Hicks contractions are making you uncomfortable: °? Change your position from lying down or resting to walking, or change from walking to resting. °? Sit and rest in a tub of warm water. °? Drink enough fluid to keep your urine pale yellow. Dehydration may cause these contractions. °? Do slow and deep breathing several times an hour. °· Keep all follow-up prenatal visits as told by your health care provider. This is important. °Contact a health care provider if: °· You have a fever. °· You have continuous pain in your abdomen. °Get help right away if: °· Your contractions become stronger, more regular, and closer together. °· You have fluid leaking or gushing from your vagina. °· You pass blood-tinged mucus (bloody show). °· You have bleeding from your vagina. °· You have low back pain that you never had before. °· You feel your baby’s head pushing down and causing pelvic pressure. °· Your baby is not moving inside you as much as it used to. °Summary °· Contractions that occur before labor are called Braxton   Hicks contractions, false labor, or practice contractions. °· Braxton Hicks contractions are usually shorter, weaker, farther apart, and less regular than true labor contractions. True labor contractions usually become progressively stronger and regular and they become more frequent. °· Manage discomfort from Braxton Hicks contractions by  changing position, resting in a warm bath, drinking plenty of water, or practicing deep breathing. °This information is not intended to replace advice given to you by your health care provider. Make sure you discuss any questions you have with your health care provider. °Document Released: 05/11/2016 Document Revised: 05/11/2016 Document Reviewed: 05/11/2016 °Elsevier Interactive Patient Education © 2018 [employer] ° °

## 2017-01-08 NOTE — MAU Note (Signed)
Pt here last night told she was 3cm. Ctx are about 5 min apart now an d very painful. Reports some bloody show denies SROM.  fetal movement a little less than usual.

## 2017-01-08 NOTE — MAU Note (Signed)
Pt presents to mau with contractions and bleeding that started @ 2100 01/07/2017. Denies LOF. +FM.

## 2017-01-08 NOTE — MAU Provider Note (Signed)
History     CSN: [id number]  Arrival date and time: 01/08/17 0006   First Provider Initiated Contact with Patient 01/08/17 0058      Chief Complaint  Patient presents with  . Contractions  . Vaginal Bleeding   HPI  Ms.  Gina Hernandez is a 26 y.o. year old G1P0 female at 5269w2d weeks gestation who presents to MAU reporting Labor and bloody show that started at 2100 on 12/30. She denies LOF. She reports good (+) FM. She reports feeling a "little anxious".  Past Medical History:  Diagnosis Date  . Anxiety   . History of chickenpox   . PCOS (polycystic ovarian syndrome)     Past Surgical History:  Procedure Laterality Date  . CHOLECYSTECTOMY N/A 06/27/2014   Procedure: LAPAROSCOPIC CHOLECYSTECTOMY ;  Surgeon: Violeta GelinasBurke Thompson, MD;  [facility];  Service: General;  Laterality: N/A;  . galbladder    . WISDOM TOOTH EXTRACTION      Family History  Problem Relation Age of Onset  . Cancer Neg Hx   . Birth defects Neg Hx   . Diabetes Neg Hx   . Hypertension Neg Hx   . Stroke Neg Hx     Social History   Tobacco Use  . Smoking status: Never Smoker  . Smokeless tobacco: Never Used  Substance Use Topics  . Alcohol use: No  . Drug use: No    Allergies: No Known Allergies  Medications Prior to Admission  Medication Sig Dispense Refill Last Dose  . Prenat-FeAsp-Meth-FA-DHA w/o A (PRENATE DHA) 18-0.6-0.4-300 MG CAPS Take by mouth.   Taking    Review of Systems  Constitutional: Negative.   HENT: Negative.   Eyes: Negative.   Respiratory: Negative.   Cardiovascular: Negative.   Gastrointestinal: Positive for abdominal pain (UC's).  Endocrine: Negative.   Genitourinary: Negative.   Musculoskeletal: Negative.   Skin: Negative.   Allergic/Immunologic: Negative.   Neurological: Negative.   Hematological: Negative.   Psychiatric/Behavioral: Negative.    Physical Exam   Patient Vitals for the past 24 hrs:  BP Temp Temp src Pulse Resp SpO2 Height Weight  01/08/17  0146 133/86 - - 71 - 100 % - -  01/08/17 0141 - - - - - 98 % - -  01/08/17 0136 - - - - - 98 % - -  01/08/17 0131 131/75 - - 66 - 97 % - -  01/08/17 0126 - - - - - 97 % - -  01/08/17 0121 - - - - - 98 % - -  01/08/17 0116 129/72 - - 70 - 99 % - -  01/08/17 0111 - - - - - 99 % - -  01/08/17 0106 - - - - - 98 % - -  01/08/17 0101 124/66 - - 70 - 98 % - -  01/08/17 0056 - - - - - 97 % - -  01/08/17 0051 - - - - - 98 % - -  01/08/17 0048 (!) 141/75 - - 70 - - - -  01/08/17 0046 - - - - - 99 % - -  01/08/17 0041 - - - - - 99 % - -  01/08/17 0036 - - - - - 98 % - -  01/08/17 0032 (!) 141/73 - - 69 - - - -  01/08/17 0031 (!) 144/77 - - 74 - 98 % - -  01/08/17 0030 (!) 141/73 98.4 F (36.9 C) Oral 74 17 100 % 5\' 8"  (1.727 m) 274  lb 4 oz (124.4 kg)    Blood pressure 133/86, pulse 71, temperature 98.4 F (36.9 C), temperature source Oral, resp. rate 17, height 5\' 8"  (1.727 m), weight 274 lb 4 oz (124.4 kg), last menstrual period 04/15/2016, SpO2 100 %.  Physical Exam  Nursing note and vitals reviewed. Constitutional: She is oriented to person, place, and time. She appears well-developed and well-nourished.  HENT:  Head: Normocephalic.  Eyes: Pupils are equal, round, and reactive to light.  Neck: Normal range of motion.  Cardiovascular: Normal rate, regular rhythm and normal heart sounds.  Respiratory: Effort normal and breath sounds normal.  GI: Soft. Bowel sounds are normal.  Musculoskeletal: Normal range of motion.  Neurological: She is alert and oriented to person, place, and time.  Skin: Skin is warm and dry.  Psychiatric: She has a normal mood and affect. Her behavior is normal. Judgment and thought content normal.   Dilation: 3 // Recheck unchanged per RN Effacement (%): 50 Cervical Position: Middle Station: -3 Presentation: Vertex Exam by: Candy SledgeH. Hardy, RN    MAU Course  Procedures  MDM CCUA NST - FHR: 140 bpm / moderate variability / accels present / decels absent /  TOCO: regular every 3-5 mins  Results for orders placed or performed during the hospital encounter of 01/08/17 (from the past 24 hour(s))  Urinalysis, Routine w reflex microscopic     Status: Abnormal   Collection Time: 01/08/17 12:18 AM  Result Value Ref Range   Color, Urine YELLOW YELLOW   APPearance HAZY (A) CLEAR   Specific Gravity, Urine 1.025 1.005 - 1.030   pH 6.0 5.0 - 8.0   Glucose, UA NEGATIVE NEGATIVE mg/dL   Hgb urine dipstick LARGE (A) NEGATIVE   Bilirubin Urine NEGATIVE NEGATIVE   Ketones, ur NEGATIVE NEGATIVE mg/dL   Protein, ur NEGATIVE NEGATIVE mg/dL   Nitrite NEGATIVE NEGATIVE   Leukocytes, UA MODERATE (A) NEGATIVE   RBC / HPF 0-5 0 - 5 RBC/hpf   WBC, UA 0-5 0 - 5 WBC/hpf   Bacteria, UA RARE (A) NONE SEEN   Squamous Epithelial / LPF 6-30 (A) NONE SEEN   Mucus PRESENT   Protein / creatinine ratio, urine     Status: None   Collection Time: 01/08/17 12:18 AM  Result Value Ref Range   Creatinine, Urine 196.00 mg/dL   Total Protein, Urine 17 mg/dL   Protein Creatinine Ratio 0.09 0.00 - 0.15 mg/mg[Cre]  CBC     Status: Abnormal   Collection Time: 01/08/17 12:53 AM  Result Value Ref Range   WBC 14.0 (H) 4.0 - 10.5 K/uL   RBC 4.05 3.87 - 5.11 MIL/uL   Hemoglobin 11.1 (L) 12.0 - 15.0 g/dL   HCT 16.134.0 (L) 09.636.0 - 04.546.0 %   MCV 84.0 78.0 - 100.0 fL   MCH 27.4 26.0 - 34.0 pg   MCHC 32.6 30.0 - 36.0 g/dL   RDW 40.913.6 81.111.5 - 91.415.5 %   Platelets 272 150 - 400 K/uL  Comprehensive metabolic panel     Status: Abnormal   Collection Time: 01/08/17 12:53 AM  Result Value Ref Range   Sodium 131 (L) 135 - 145 mmol/L   Potassium 3.5 3.5 - 5.1 mmol/L   Chloride 101 101 - 111 mmol/L   CO2 20 (L) 22 - 32 mmol/L   Glucose, Bld 87 65 - 99 mg/dL   BUN 10 6 - 20 mg/dL   Creatinine, Ser 7.820.58 0.44 - 1.00 mg/dL   Calcium 8.1 (L) 8.9 -  10.3 mg/dL   Total Protein 6.4 (L) 6.5 - 8.1 g/dL   Albumin 2.5 (L) 3.5 - 5.0 g/dL   AST 16 15 - 41 U/L   ALT 16 14 - 54 U/L   Alkaline Phosphatase 132  (H) 38 - 126 U/L   Total Bilirubin 0.6 0.3 - 1.2 mg/dL   GFR calc non Af Amer >60 >60 mL/min   GFR calc Af Amer >60 >60 mL/min   Anion gap 10 5 - 15    Assessment and Plan  Vaginal bleeding in pregnancy, third trimester - Reassurance given that bloody show is normal  - Bleeding precautions given - Return to MAU for heavier bleeding like a period  Elevated blood pressure affecting pregnancy in third trimester, antepartum - Advised of normal lab results and blood pressures - Information provided on hypertension in pregnancy   Discharge home Keep scheduled appts on 1/3 & 1/4 Patient verbalized an understanding of the plan of care and agrees.   Raelyn Mora, MSN, CNM 01/08/2017, 12:58 AM

## 2017-01-08 NOTE — Telephone Encounter (Signed)
Pt called to office stating that [facility] and is at home still ctx. Would like to know what to do. Pt [facility] ctx are still q335min.  Pt states that she has not felt much FM this morning and states her abd is sore/hurts. [facility] today and for evaluation.  Pt states understanding.

## 2017-01-09 NOTE — L&D Delivery Note (Signed)
Delivery Note Pt became complete at 0440 and pushed well; at 5:12 AM a viable female was delivered via Vaginal, Spontaneous (Presentation: ROA ).  APGAR:8 ,9 ; weight: pending.  Infant dried and lifted to pt's abd; cord clamped and cut by FOB. Hospital cord blood sample collected. Placenta status: spont , intact.  Cord: 3 vessel  Anesthesia:  Epidural Episiotomy: None Lacerations: None Est. Blood Loss (mL): 150  Mom to postpartum.  Baby to Couplet care / Skin to Skin.  Cam HaiSHAW, KIMBERLY 01/10/2017, 5:27 AM  Please schedule this patient for Postpartum visit in: 4 weeks with the following provider: Any provider For C/S patients schedule nurse incision check in weeks 2 weeks: no Low risk pregnancy complicated by: none Delivery mode:  SVD Anticipated Birth Control:  other/unsure PP Procedures needed: none Schedule Integrated BH visit: no

## 2017-01-10 ENCOUNTER — Inpatient Hospital Stay (HOSPITAL_COMMUNITY)
Admission: AD | Admit: 2017-01-10 | Discharge: 2017-01-12 | DRG: 807 | Disposition: A | Discharge status: Home / Self Care | Payer: BLUE CROSS/BLUE SHIELD | Source: Ambulatory Visit | Attending: Family Medicine | Admitting: Family Medicine

## 2017-01-10 ENCOUNTER — Inpatient Hospital Stay (HOSPITAL_COMMUNITY): Payer: BLUE CROSS/BLUE SHIELD | Admitting: Anesthesiology

## 2017-01-10 ENCOUNTER — Encounter (HOSPITAL_COMMUNITY): Payer: Self-pay

## 2017-01-10 DIAGNOSIS — O134 Gestational [pregnancy-induced] hypertension without significant proteinuria, complicating childbirth: Secondary | ICD-10-CM | POA: Diagnosis present

## 2017-01-10 DIAGNOSIS — O4290 Premature rupture of membranes, unspecified as to length of time between rupture and onset of labor, unspecified weeks of gestation: Secondary | ICD-10-CM | POA: Diagnosis present

## 2017-01-10 DIAGNOSIS — Z3A38 38 weeks gestation of pregnancy: Secondary | ICD-10-CM

## 2017-01-10 DIAGNOSIS — Z3483 Encounter for supervision of other normal pregnancy, third trimester: Secondary | ICD-10-CM | POA: Diagnosis present

## 2017-01-10 DIAGNOSIS — O4693 Antepartum hemorrhage, unspecified, third trimester: Secondary | ICD-10-CM

## 2017-01-10 DIAGNOSIS — O99214 Obesity complicating childbirth: Secondary | ICD-10-CM | POA: Diagnosis present

## 2017-01-10 DIAGNOSIS — O163 Unspecified maternal hypertension, third trimester: Secondary | ICD-10-CM

## 2017-01-10 LAB — CBC
HCT: 36.5 % (ref 36.0–46.0)
HEMOGLOBIN: 12.2 g/dL (ref 12.0–15.0)
MCH: 27.4 pg (ref 26.0–34.0)
MCHC: 33.4 g/dL (ref 30.0–36.0)
MCV: 82 fL (ref 78.0–100.0)
Platelets: 287 10*3/uL (ref 150–400)
RBC: 4.45 MIL/uL (ref 3.87–5.11)
RDW: 13.8 % (ref 11.5–15.5)
WBC: 14.4 10*3/uL — ABNORMAL HIGH (ref 4.0–10.5)

## 2017-01-10 LAB — RPR: RPR: NONREACTIVE

## 2017-01-10 LAB — ABO/RH: ABO/RH(D): A POS

## 2017-01-10 LAB — TYPE AND SCREEN
ABO/RH(D): A POS
ANTIBODY SCREEN: NEGATIVE

## 2017-01-10 LAB — POCT FERN TEST: POCT FERN TEST: POSITIVE

## 2017-01-10 MED ORDER — ZOLPIDEM TARTRATE 5 MG PO TABS: 5.0000 mg | ORAL_TABLET | Freq: Every evening | ORAL | Status: DC | PRN

## 2017-01-10 MED ORDER — SOD CITRATE-CITRIC ACID 500-334 MG/5ML PO SOLN: 30.0000 mL | ORAL | Status: DC | PRN

## 2017-01-10 MED ORDER — COCONUT OIL OIL
1.0000 "application " | TOPICAL_OIL | Status: DC | PRN
  Administered 2017-01-12: 1 via TOPICAL
  Filled 2017-01-10: qty 120

## 2017-01-10 MED ORDER — OXYCODONE-ACETAMINOPHEN 5-325 MG PO TABS: 1.0000 | ORAL_TABLET | ORAL | Status: DC | PRN

## 2017-01-10 MED ORDER — IBUPROFEN 600 MG PO TABS
600.0000 mg | ORAL_TABLET | Freq: Four times a day (QID) | ORAL | Status: DC
  Administered 2017-01-10 – 2017-01-12 (×8): 600 mg via ORAL
  Filled 2017-01-10 (×8): qty 1

## 2017-01-10 MED ORDER — FENTANYL 2.5 MCG/ML BUPIVACAINE 1/10 % EPIDURAL INFUSION (WH - ANES)
INTRAMUSCULAR | Status: AC
  Filled 2017-01-10: qty 100

## 2017-01-10 MED ORDER — LACTATED RINGERS IV SOLN
500.0000 mL | Freq: Once | INTRAVENOUS | Status: AC
  Administered 2017-01-10: 500 mL via INTRAVENOUS

## 2017-01-10 MED ORDER — LIDOCAINE HCL (PF) 1 % IJ SOLN
INTRAMUSCULAR | Status: AC
  Filled 2017-01-10: qty 30

## 2017-01-10 MED ORDER — PRENATAL MULTIVITAMIN CH
1.0000 | ORAL_TABLET | Freq: Every day | ORAL | Status: DC
  Administered 2017-01-10 – 2017-01-11 (×2): 1 via ORAL
  Filled 2017-01-10 (×2): qty 1

## 2017-01-10 MED ORDER — SENNOSIDES-DOCUSATE SODIUM 8.6-50 MG PO TABS
2.0000 | ORAL_TABLET | ORAL | Status: DC
  Administered 2017-01-10 – 2017-01-11 (×2): 2 via ORAL
  Filled 2017-01-10 (×2): qty 2

## 2017-01-10 MED ORDER — FENTANYL 2.5 MCG/ML BUPIVACAINE 1/10 % EPIDURAL INFUSION (WH - ANES)
14.0000 mL/h | INTRAMUSCULAR | Status: DC | PRN
  Administered 2017-01-10: 14 mL/h via EPIDURAL

## 2017-01-10 MED ORDER — ONDANSETRON HCL 4 MG/2ML IJ SOLN: 4.0000 mg | INTRAMUSCULAR | Status: DC | PRN

## 2017-01-10 MED ORDER — ACETAMINOPHEN 325 MG PO TABS: 650.0000 mg | ORAL_TABLET | ORAL | Status: DC | PRN

## 2017-01-10 MED ORDER — ONDANSETRON HCL 4 MG PO TABS
4.0000 mg | ORAL_TABLET | ORAL | Status: DC | PRN
Start: 2017-01-10 — End: 2017-01-12

## 2017-01-10 MED ORDER — TETANUS-DIPHTH-ACELL PERTUSSIS 5-2.5-18.5 LF-MCG/0.5 IM SUSP: 0.5000 mL | Freq: Once | INTRAMUSCULAR | Status: DC

## 2017-01-10 MED ORDER — DIBUCAINE 1 % RE OINT: 1.0000 "application " | TOPICAL_OINTMENT | RECTAL | Status: DC | PRN

## 2017-01-10 MED ORDER — WITCH HAZEL-GLYCERIN EX PADS: 1.0000 "application " | MEDICATED_PAD | CUTANEOUS | Status: DC | PRN

## 2017-01-10 MED ORDER — LACTATED RINGERS IV SOLN
INTRAVENOUS | Status: DC
  Administered 2017-01-10: 02:00:00 via INTRAVENOUS

## 2017-01-10 MED ORDER — OXYTOCIN BOLUS FROM INFUSION
500.0000 mL | Freq: Once | INTRAVENOUS | Status: AC
  Administered 2017-01-10: 500 mL via INTRAVENOUS

## 2017-01-10 MED ORDER — BENZOCAINE-MENTHOL 20-0.5 % EX AERO
1.0000 "application " | INHALATION_SPRAY | CUTANEOUS | Status: DC | PRN
  Administered 2017-01-10: 1 via TOPICAL
  Filled 2017-01-10: qty 56

## 2017-01-10 MED ORDER — OXYTOCIN 40 UNITS IN LACTATED RINGERS INFUSION - SIMPLE MED
2.5000 [IU]/h | INTRAVENOUS | Status: DC
  Administered 2017-01-10: 2.5 [IU]/h via INTRAVENOUS

## 2017-01-10 MED ORDER — SIMETHICONE 80 MG PO CHEW: 80.0000 mg | CHEWABLE_TABLET | ORAL | Status: DC | PRN

## 2017-01-10 MED ORDER — OXYCODONE-ACETAMINOPHEN 5-325 MG PO TABS: 2.0000 | ORAL_TABLET | ORAL | Status: DC | PRN

## 2017-01-10 MED ORDER — DIPHENHYDRAMINE HCL 25 MG PO CAPS: 25.0000 mg | ORAL_CAPSULE | Freq: Four times a day (QID) | ORAL | Status: DC | PRN

## 2017-01-10 MED ORDER — OXYCODONE HCL 5 MG PO TABS: 5.0000 mg | ORAL_TABLET | ORAL | Status: DC | PRN

## 2017-01-10 MED ORDER — OXYTOCIN 40 UNITS IN LACTATED RINGERS INFUSION - SIMPLE MED
INTRAVENOUS | Status: AC
  Administered 2017-01-10: 2.5 [IU]/h via INTRAVENOUS
  Filled 2017-01-10: qty 1000

## 2017-01-10 MED ORDER — LIDOCAINE HCL (PF) 1 % IJ SOLN
INTRAMUSCULAR | Status: DC | PRN
  Administered 2017-01-10 (×2): 4 mL via EPIDURAL

## 2017-01-10 MED ORDER — ONDANSETRON HCL 4 MG/2ML IJ SOLN: 4.0000 mg | Freq: Four times a day (QID) | INTRAMUSCULAR | Status: DC | PRN

## 2017-01-10 MED ORDER — FENTANYL CITRATE (PF) 100 MCG/2ML IJ SOLN: 100.0000 ug | INTRAMUSCULAR | Status: DC | PRN

## 2017-01-10 MED ORDER — PHENYLEPHRINE 40 MCG/ML (10ML) SYRINGE FOR IV PUSH (FOR BLOOD PRESSURE SUPPORT)
PREFILLED_SYRINGE | INTRAVENOUS | Status: AC
  Filled 2017-01-10: qty 20

## 2017-01-10 MED ORDER — LACTATED RINGERS IV SOLN: 500.0000 mL | INTRAVENOUS | Status: DC | PRN

## 2017-01-10 MED ORDER — DIPHENHYDRAMINE HCL 50 MG/ML IJ SOLN: 12.5000 mg | INTRAMUSCULAR | Status: DC | PRN

## 2017-01-10 MED ORDER — PHENYLEPHRINE 40 MCG/ML (10ML) SYRINGE FOR IV PUSH (FOR BLOOD PRESSURE SUPPORT)
80.0000 ug | PREFILLED_SYRINGE | INTRAVENOUS | Status: DC | PRN
  Filled 2017-01-10: qty 5

## 2017-01-10 MED ORDER — LIDOCAINE HCL (PF) 1 % IJ SOLN
30.0000 mL | INTRAMUSCULAR | Status: DC | PRN
  Filled 2017-01-10: qty 30

## 2017-01-10 NOTE — H&P (Signed)
Gina Hernandez is a 27 y.o. female G1 @ 38.4wks by LMP and confirmed by 9wk scan presenting for eval of leaking fluid since a little after midnight and reg ctx. Denies s/s pre-e. Her preg has been followed by the [facility] office x 3wks w/ tx of care from [facility]. Her preg has been remarkable for 1) PCOS 2) GBS neg  OB History    Gravida Para Term Preterm AB Living   1             SAB TAB Ectopic Multiple Live Births                 Past Medical History:  Diagnosis Date  . Anxiety   . History of chickenpox   . PCOS (polycystic ovarian syndrome)    Past Surgical History:  Procedure Laterality Date  . CHOLECYSTECTOMY N/A 06/27/2014   Procedure: LAPAROSCOPIC CHOLECYSTECTOMY ;  Surgeon: Violeta GelinasBurke Thompson, MD;  [facility];  Service: General;  Laterality: N/A;  . galbladder    . WISDOM TOOTH EXTRACTION     Family History: family history is not on file. Social History:  reports that  has never smoked. she has never used smokeless tobacco. She reports that she does not drink alcohol or use drugs.     Maternal Diabetes: No Genetic Screening: Normal Maternal Ultrasounds/Referrals: Normal Fetal Ultrasounds or other Referrals:  None Maternal Substance Abuse:  No Significant Maternal Medications:  None Significant Maternal Lab Results:  Lab values include: Group B Strep negative Other Comments:  None  ROS History Dilation: 6 Effacement (%): 80 Station: -1 Exam by:: Saks Incorporatedikki Risheq, RN  Blood pressure 133/77, pulse 77, temperature 98.2 F (36.8 C), temperature source Oral, resp. rate 20, height 5\' 8"  (1.727 m), weight 123.4 kg (272 lb), last menstrual period 04/15/2016, SpO2 100 %. Exam Physical Exam  Constitutional: She is oriented to person, place, and time. She appears well-developed.  HENT:  Head: Normocephalic.  Neck: Normal range of motion.  Cardiovascular: Normal rate.  Respiratory: Effort normal.  GI:  EFM 150s, +accels, no decels Ctx q 2 mins   Musculoskeletal: Normal range of motion.  Neurological: She is alert and oriented to person, place, and time.  Skin: Skin is warm and dry.  Psychiatric: She has a normal mood and affect. Her behavior is normal. Thought content normal.    Prenatal labs: ABO, Rh: A/Positive/-- (06/15 0000) Antibody:  neg (6/14) Rubella:  immune (6/18) RPR: Nonreactive (10/18 0000)  HBsAg: Negative (06/15 0000)  HIV: Non-reactive (10/18 0000)  GBS:   neg (12/24/16)  Assessment/Plan: IUP@term  Active labor/ROM GBS neg  [facility] Anticipate SVD   Jakita Dutkiewicz CNM 01/10/2017, 1:16 AM

## 2017-01-10 NOTE — Anesthesia Procedure Notes (Signed)
Epidural Patient location during procedure: OB Start time: 01/10/2017 2:36 AM  Staffing Anesthesiologist: Mal AmabileFoster, Lashya Passe, MD Performed: anesthesiologist   Preanesthetic Checklist Completed: patient identified, site marked, surgical consent, pre-op evaluation, timeout performed, IV checked, risks and benefits discussed and monitors and equipment checked  Epidural Patient position: sitting Prep: site prepped and draped and DuraPrep Patient monitoring: continuous pulse ox and blood pressure Approach: midline Location: L3-L4 Injection technique: LOR air  Needle:  Needle type: Tuohy  Needle gauge: 17 G Needle length: 9 cm and 9 Needle insertion depth: 6 cm Catheter type: closed end flexible Catheter size: 19 Gauge Catheter at skin depth: 11 cm Test dose: negative and Other  Assessment Events: blood not aspirated, injection not painful, no injection resistance, negative IV test and no paresthesia  Additional Notes Patient identified. Risks and benefits discussed including failed block, incomplete  Pain control, post dural puncture headache, nerve damage, paralysis, blood pressure Changes, nausea, vomiting, reactions to medications-both toxic and allergic and post Partum back pain. All questions were answered. Patient expressed understanding and wished to proceed. Sterile technique was used throughout procedure. Epidural site was Dressed with sterile barrier dressing. No paresthesias, signs of intravascular injection Or signs of intrathecal spread were encountered.  Patient was more comfortable after the epidural was dosed. Please see RN's note for documentation of vital signs and FHR which are stable.

## 2017-01-10 NOTE — Anesthesia Postprocedure Evaluation (Signed)
Anesthesia Post Note  Patient: Gina Hernandez  Procedure(s) Performed: AN AD HOC LABOR EPIDURAL     Patient location during evaluation: Mother Baby Anesthesia Type: Epidural Level of consciousness: awake Pain management: satisfactory to patient Vital Signs Assessment: post-procedure vital signs reviewed and stable Respiratory status: spontaneous breathing Cardiovascular status: stable Anesthetic complications: no    Last Vitals:  Vitals:   01/10/17 0825 01/10/17 1235  BP: 116/68 (!) 113/59  Pulse: 89 81  Resp: 18 18  Temp: 37.2 C 37.1 C  SpO2:      Last Pain:  Vitals:   01/10/17 1235  TempSrc: Oral  PainSc:    Pain Goal:                 KeyCorpBURGER,Cage Gupton

## 2017-01-10 NOTE — MAU Note (Signed)
Pt here with c/o rupture of membranes about 0010. Been contracting for the last two days. Has had some spotting. Reports good fetal movement.

## 2017-01-10 NOTE — Anesthesia Preprocedure Evaluation (Signed)
Anesthesia Evaluation  Patient identified by MRN, date of birth, ID band Patient awake    Reviewed: Allergy & Precautions, Patient's Chart, lab work & pertinent test results  Airway Mallampati: I  TM Distance: >3 FB Neck ROM: Full    Dental no notable dental hx. (+) Teeth Intact   Pulmonary neg pulmonary ROS,    Pulmonary exam normal breath sounds clear to auscultation       Cardiovascular negative cardio ROS Normal cardiovascular exam Rhythm:Regular Rate:Normal     Neuro/Psych Anxiety negative neurological ROS     GI/Hepatic Neg liver ROS, GERD  ,  Endo/Other  Morbid obesityPCOS  Renal/GU negative Renal ROS  negative genitourinary   Musculoskeletal negative musculoskeletal ROS (+)   Abdominal (+) + obese,   Peds  Hematology negative hematology ROS (+)   Anesthesia Other Findings   Reproductive/Obstetrics (+) Pregnancy                             Anesthesia Physical Anesthesia Plan  ASA: III  Anesthesia Plan: Epidural   Post-op Pain Management:    Induction:   PONV Risk Score and Plan:   Airway Management Planned: Natural Airway  Additional Equipment:   Intra-op Plan:   Post-operative Plan:   Informed Consent: I have reviewed the patients History and Physical, chart, labs and discussed the procedure including the risks, benefits and alternatives for the proposed anesthesia with the patient or authorized representative who has indicated his/her understanding and acceptance.     Plan Discussed with: Anesthesiologist  Anesthesia Plan Comments:         Anesthesia Quick Evaluation

## 2017-01-10 NOTE — Lactation Note (Signed)
This note was copied from a baby's chart. Lactation Consultation Note  Patient Name: Gina Hernandez [id number]'YToday's Date: 01/10/2017 Reason for consult: Initial assessment;Maternal endocrine disorder Type of Endocrine Disorder?: PCOS   Initial consult with mom of 5 hour old infant. Infant with 3 BF for 10-60 minutes, 1 BF attempt, 1 void and 1 stool since birth. LATCH scores 7-9. Infant weight 6 lb 7.5 oz.   Mom reports infant just came off the breast when LC entered room. Mom reports pain throughout most of feeding. Infant then began sucking on hands. Mom relatched her to the left breast. Infant latched easily with mom using good hand and pillow support. Mom reported pain after latch, infant lower lip was curled in, flanged lip and mom reports increased comfort. Infant with intermittent swallows. Infant fell asleep pretty quickly, reviewed awakening techniques and importance of maintaining deep latch and active feeding at the breast. Infant still feeding when LC left room. Enc mom to massage/intermittenly compress breast with feeding to maximize milk transfer.   Mom with wide space between breasts and somewhat cone shape to breasts. Maternal history of PCOS. Mom reports no breast growth with pregnancy, she did have areolar darkening. Showed mom how to hand express on the right breast while infant on the left, no colostrum expressible at this time. Enc mom to hand express before feeding to get milk flowing and after to apply EBM to nipples.   Enc mom to feed infant STS 8-12 x in 24 hours at first feeding cues, using head and pillow support throughout feeding. Advised mom to use both breasts with each feeding. Reviewed I/O, NB nutritional needs, BF basics, hand expression, colostrum, milk coming to volume, stomach size of the NB, and feeding cues.   [facility] and has a follow up appt scheduled. Mom has an Aeroflow pump at home. BF Resources handout and LC Brochure given, mom informed of IP/OP  Services, BF Support Groups and LC phone #. Enc mom to call out for feeding assistance as needed.    Maternal Data Formula Feeding for Exclusion: No Has patient been taught Hand Expression?: Yes Does the patient have breastfeeding experience prior to this delivery?: No  Feeding Feeding Type: Breast Fed Length of feed: 10 min(still BF when LC entered room)  LATCH Score Latch: Grasps breast easily, tongue down, lips flanged, rhythmical sucking.  Audible Swallowing: Spontaneous and intermittent  Type of Nipple: Everted at rest and after stimulation  Comfort (Breast/Nipple): Filling, red/small blisters or bruises, mild/mod discomfort  Hold (Positioning): Assistance needed to correctly position infant at breast and maintain latch.  LATCH Score: 8  Interventions Interventions: Breast feeding basics reviewed;Support pillows;Assisted with latch;Position options;Skin to skin;Expressed milk;Breast massage;Breast compression;Hand express  Lactation Tools Discussed/Used WIC Program: Yes   Consult Status Consult Status: Follow-up Date: 01/11/17 Follow-up type: In-patient    Gina Hernandez 01/10/2017, 10:59 AM

## 2017-01-11 ENCOUNTER — Ambulatory Visit (HOSPITAL_COMMUNITY): Payer: BLUE CROSS/BLUE SHIELD

## 2017-01-11 LAB — BIRTH TISSUE RECOVERY COLLECTION (PLACENTA DONATION)

## 2017-01-11 NOTE — Lactation Note (Signed)
This note was copied from a baby's chart. Lactation Consultation Note Baby 25 hrs old. Mom upset baby is feeding so much and thinks the baby is hungry. RN had explained cluster feeding. Mom wanted to talk w/LC. Mom walking in rm BF in cradle position when LC entered rm. Baby didn't have cheeks to breast, baby feeding w/wide space to one side. Assessed breast, mom stated baby had been BF for 10 min. On that one side. Breast didn't feel any different than the other breast. Asked mom to assess before and after BF for transfer.  Asked mom to get into bed and LC assisted in football position. Baby latched well. Assisted in props and comfort for mom. Mom stated positioning and latch felt good. Baby has good mobility of tongue. Baby does have upper tight labial frenulum. Encouraged mom to flange upper lip if not. Mom did so well. After baby BF for 5 min. Noted softening of breast. Mom happy to feel difference in breast. Discussed I&O, demonstrated hand expression w/colostrum noted. Discussed what to look for and how baby will act if not getting enough from breast. Baby does look slightly jaundice, encouraged to alert RN if notice increase.  Mom BF well when LC left. Reported to RN. Patient Name: Gina Thurmond ButtsRavaye Mcminn ZOXWR'UToday's Date: 01/11/2017 Reason for consult: Follow-up assessment;Mother's request   Maternal Data    Feeding Feeding Type: Breast Fed Length of feed: 20 min  LATCH Score Latch: Grasps breast easily, tongue down, lips flanged, rhythmical sucking.  Audible Swallowing: Spontaneous and intermittent  Type of Nipple: Everted at rest and after stimulation  Comfort (Breast/Nipple): Soft / non-tender  Hold (Positioning): Assistance needed to correctly position infant at breast and maintain latch.  LATCH Score: 9  Interventions Interventions: Breast feeding basics reviewed;Breast compression;Assisted with latch;Adjust position;Skin to skin;Support pillows;Breast massage;Position options;Hand  express  Lactation Tools Discussed/Used     Consult Status Consult Status: Follow-up Date: 01/13/16 Follow-up type: In-patient    Charyl DancerCARVER, Rhyan Radler G 01/11/2017, 6:28 AM

## 2017-01-11 NOTE — Progress Notes (Signed)
Post Partum Day 1 Subjective: no complaints, up ad lib, voiding, tolerating PO and Having some trouble with breastfeeding Wants to go home today, but RN states baby might have jaundice and that MOB requires a lot of support with BF Objective: Blood pressure 112/61, pulse 71, temperature 98.4 F (36.9 C), temperature source Oral, resp. rate 18, height 5\' 8"  (1.727 m), weight 255 lb (115.7 kg), last menstrual period 04/15/2016, SpO2 100 %, unknown if currently breastfeeding.  Physical Exam:  General: alert, cooperative and no distress Lochia: appropriate Uterine Fundus: firm Incision: n/a DVT Evaluation: No evidence of DVT seen on physical exam.  Recent Labs    01/10/17 0125  HGB 12.2  HCT 36.5    Assessment/Plan: Plan for discharge tomorrow, Breastfeeding and Lactation consult   LOS: 1 day   Wynelle BourgeoisMarie Chau Savell 01/11/2017, 7:16 AM

## 2017-01-12 ENCOUNTER — Telehealth: Payer: Self-pay

## 2017-01-12 ENCOUNTER — Encounter: Payer: BLUE CROSS/BLUE SHIELD | Admitting: Family Medicine

## 2017-01-12 MED ORDER — IBUPROFEN 600 MG PO TABS: 600.0000 mg | ORAL_TABLET | Freq: Four times a day (QID) | ORAL | 0 refills | Status: DC

## 2017-01-12 NOTE — Discharge Summary (Signed)
OB Discharge Summary     Patient Name: Gina ButtsRavaye Sherrin DOB: September 06, 1990 MRN: [id number]  Date of admission: 01/10/2017 Delivering MD: Cam HaiSHAW, KIMBERLY D   Date of discharge: 01/12/2017  Admitting diagnosis: 38 WEEKS ROM CTX Intrauterine pregnancy: 1550w4d     Secondary diagnosis:  Active Problems:   Amniotic fluid leaking  Additional problems: Transient hypertension early labor, resolved. Normal labs     Discharge diagnosis: Term Pregnancy Delivered and Transient hypertension, resolve                                                                                                Post partum procedures:none  Augmentation: AROM  Complications: None  Hospital course:  Onset of Labor With Vaginal Delivery     27 y.o. yo G1P1001 at 6050w4d was admitted in Latent Labor on 01/10/2017. Patient had an uncomplicated labor course as follows:  Membrane Rupture Time/Date: 12:10 AM ,01/10/2017   Intrapartum Procedures: Episiotomy: None [1]                                         Lacerations:  None [1]  Patient had a delivery of a Viable infant. 01/10/2017  Information for the patient's newborn:  De BlanchDarby, Girl Swetha [[id number]][[id number]]  Delivery Method: Vaginal, Spontaneous(Filed from Delivery Summary)    Pateint had an uncomplicated postpartum course.  She is ambulating, tolerating a regular diet, passing flatus, and urinating well. Patient is discharged home in stable condition on 01/12/17.   Physical exam  Vitals:   01/10/17 2045 01/11/17 0555 01/11/17 1721 01/12/17 0630  BP: (!) 102/58 112/61 125/63 128/63  Pulse: 73 71 65 (!) 57  Resp: 18 18 18 18   Temp: 99.7 F (37.6 C) 98.4 F (36.9 C) 98.2 F (36.8 C) 98.2 F (36.8 C)  TempSrc: Oral Oral Oral Tympanic  SpO2:   100% 100%  Weight:  255 lb (115.7 kg)  255 lb 8 oz (115.9 kg)  Height:       General: alert, cooperative and no distress Lochia: appropriate Uterine Fundus: firm Incision: Healing well with no significant drainage DVT Evaluation: No  evidence of DVT seen on physical exam. Labs: Lab Results  Component Value Date   WBC 14.4 (H) 01/10/2017   HGB 12.2 01/10/2017   HCT 36.5 01/10/2017   MCV 82.0 01/10/2017   PLT 287 01/10/2017   CMP Latest Ref Rng & Units 01/08/2017  Glucose 65 - 99 mg/dL 87  BUN 6 - 20 mg/dL 10  Creatinine 7.820.44 - 9.561.00 mg/dL 2.130.58  Sodium 086135 - 578145 mmol/L 131(L)  Potassium 3.5 - 5.1 mmol/L 3.5  Chloride 101 - 111 mmol/L 101  CO2 22 - 32 mmol/L 20(L)  Calcium 8.9 - 10.3 mg/dL 8.1(L)  Total Protein 6.5 - 8.1 g/dL 6.4(L)  Total Bilirubin 0.3 - 1.2 mg/dL 0.6  Alkaline Phos 38 - 126 U/L 132(H)  AST 15 - 41 U/L 16  ALT 14 - 54 U/L 16    Discharge instruction: per After Visit Summary and "  Baby and Me Booklet".  After visit meds:  Allergies as of 01/12/2017   No Known Allergies     Medication List    TAKE these medications   ibuprofen 600 MG tablet Commonly known as:  ADVIL,MOTRIN Take 1 tablet (600 mg total) by mouth every 6 (six) hours.   PRENATE DHA 18-0.6-0.4-300 MG Caps Take by mouth.       Diet: routine diet  Activity: Advance as tolerated. Pelvic rest for 6 weeks.   Outpatient follow up:2 weeks Follow up Appt: Future Appointments  Date Time Provider Department Center  02/12/2017  9:45 AM Willodean Rosenthal, MD [facility] None   Follow up Visit:No Follow-up on file.  Postpartum contraception: Undecided  Newborn Data: Live born female  Birth Weight: 6 lb 7.5 oz (2934 g) APGAR: 8, 9  Newborn Delivery   Birth date/time:  01/10/2017 05:12:00 Delivery type:  Vaginal, Spontaneous     Baby Feeding: Breast Disposition:home with mother   01/12/2017 Wynelle Bourgeois, CNM

## 2017-01-12 NOTE — Discharge Instructions (Signed)
Vaginal Delivery, Care After °Refer to this sheet in the next few weeks. These instructions provide you with information about caring for yourself after vaginal delivery. Your health care provider may also give you more specific instructions. Your treatment has been planned according to current medical practices, but problems sometimes occur. Call your health care provider if you have any problems or questions. °What can I expect after the procedure? °After vaginal delivery, it is common to have: °· Some bleeding from your vagina. °· Soreness in your abdomen, your vagina, and the area of skin between your vaginal opening and your anus (perineum). °· Pelvic cramps. °· Fatigue. ° °Follow these instructions at home: °Medicines °· Take over-the-counter and prescription medicines only as told by your health care provider. °· If you were prescribed an antibiotic medicine, take it as told by your health care provider. Do not stop taking the antibiotic until it is finished. °Driving ° °· Do not drive or operate heavy machinery while taking prescription pain medicine. °· Do not drive for 24 hours if you received a sedative. °Lifestyle °· Do not drink alcohol. This is especially important if you are breastfeeding or taking medicine to relieve pain. °· Do not use tobacco products, including cigarettes, chewing tobacco, or e-cigarettes. If you need help quitting, ask your health care provider. °Eating and drinking °· Drink at least 8 eight-ounce glasses of water every day unless you are told not to by your health care provider. If you choose to breastfeed your baby, you may need to drink more water than this. °· Eat high-fiber foods every day. These foods may help prevent or relieve constipation. High-fiber foods include: °? Whole grain cereals and breads. °? Brown rice. °? Beans. °? Fresh fruits and vegetables. °Activity °· Return to your normal activities as told by your health care provider. Ask your health care provider  what activities are safe for you. °· Rest as much as possible. Try to rest or take a nap when your baby is sleeping. °· Do not lift anything that is heavier than your baby or 10 lb (4.5 kg) until your health care provider says that it is safe. °· Talk with your health care provider about when you can engage in sexual activity. This may depend on your: °? Risk of infection. °? Rate of healing. °? Comfort and desire to engage in sexual activity. °Vaginal Care °· If you have an episiotomy or a vaginal tear, check the area every day for signs of infection. Check for: °? More redness, swelling, or pain. °? More fluid or blood. °? Warmth. °? Pus or a bad smell. °· Do not use tampons or douches until your health care provider says this is safe. °· Watch for any blood clots that may pass from your vagina. These may look like clumps of dark red, brown, or black discharge. °General instructions °· Keep your perineum clean and dry as told by your health care provider. °· Wear loose, comfortable clothing. °· Wipe from front to back when you use the toilet. °· Ask your health care provider if you can shower or take a bath. If you had an episiotomy or a perineal tear during labor and delivery, your health care provider may tell you not to take baths for a certain length of time. °· Wear a bra that supports your breasts and fits you well. °· If possible, have someone help you with household activities and help care for your baby for at least a few days after   you leave the hospital. °· Keep all follow-up visits for you and your baby as told by your health care provider. This is important. °Contact a health care provider if: °· You have: °? Vaginal discharge that has a bad smell. °? Difficulty urinating. °? Pain when urinating. °? A sudden increase or decrease in the frequency of your bowel movements. °? More redness, swelling, or pain around your episiotomy or vaginal tear. °? More fluid or blood coming from your episiotomy or  vaginal tear. °? Pus or a bad smell coming from your episiotomy or vaginal tear. °? A fever. °? A rash. °? Little or no interest in activities you used to enjoy. °? Questions about caring for yourself or your baby. °· Your episiotomy or vaginal tear feels warm to the touch. °· Your episiotomy or vaginal tear is separating or does not appear to be healing. °· Your breasts are painful, hard, or turn red. °· You feel unusually sad or worried. °· You feel nauseous or you vomit. °· You pass large blood clots from your vagina. If you pass a blood clot from your vagina, save it to show to your health care provider. Do not flush blood clots down the toilet without having your health care provider look at them. °· You urinate more than usual. °· You are dizzy or light-headed. °· You have not breastfed at all and you have not had a menstrual period for 12 weeks after delivery. °· You have stopped breastfeeding and you have not had a menstrual period for 12 weeks after you stopped breastfeeding. °Get help right away if: °· You have: °? Pain that does not go away or does not get better with medicine. °? Chest pain. °? Difficulty breathing. °? Blurred vision or spots in your vision. °? Thoughts about hurting yourself or your baby. °· You develop pain in your abdomen or in one of your legs. °· You develop a severe headache. °· You faint. °· You bleed from your vagina so much that you fill two sanitary pads in one hour. °This information is not intended to replace advice given to you by your health care provider. Make sure you discuss any questions you have with your health care provider. °Document Released: 12/24/1999 Document Revised: 06/09/2015 Document Reviewed: 01/10/2015 °Elsevier Interactive Patient Education © 2018 [employer] ° °

## 2017-01-12 NOTE — Telephone Encounter (Signed)
-----   Message from Allie BossierMyra C Dove, MD sent at 01/11/2017  2:58 PM EST ----- Please let her know that her bile acids are fine, no cholestasis of pregnancy. Thanks

## 2017-01-12 NOTE — Telephone Encounter (Signed)
Patient delivered. Armandina StammerJennifer Nelton Amsden RNBSN

## 2017-02-12 ENCOUNTER — Ambulatory Visit: Payer: BLUE CROSS/BLUE SHIELD | Admitting: Obstetrics & Gynecology

## 2017-02-20 ENCOUNTER — Ambulatory Visit: Payer: BLUE CROSS/BLUE SHIELD | Admitting: Advanced Practice Midwife

## 2017-03-02 ENCOUNTER — Encounter: Payer: Self-pay | Admitting: Family Medicine

## 2017-03-02 ENCOUNTER — Ambulatory Visit (INDEPENDENT_AMBULATORY_CARE_PROVIDER_SITE_OTHER): Payer: BLUE CROSS/BLUE SHIELD | Admitting: Family Medicine

## 2017-03-02 DIAGNOSIS — Z1389 Encounter for screening for other disorder: Secondary | ICD-10-CM | POA: Diagnosis not present

## 2017-03-02 DIAGNOSIS — F53 Postpartum depression: Secondary | ICD-10-CM

## 2017-03-02 DIAGNOSIS — O99345 Other mental disorders complicating the puerperium: Secondary | ICD-10-CM

## 2017-03-02 DIAGNOSIS — Z029 Encounter for administrative examinations, unspecified: Secondary | ICD-10-CM

## 2017-03-02 MED ORDER — LO LOESTRIN FE 1 MG-10 MCG / 10 MCG PO TABS
1.0000 | ORAL_TABLET | Freq: Every day | ORAL | 3 refills | Status: DC
Start: 2017-03-02 — End: 2018-05-21

## 2017-03-02 MED ORDER — TRIAMCINOLONE ACETONIDE 0.1 % EX CREA: 1.0000 "application " | TOPICAL_CREAM | Freq: Two times a day (BID) | CUTANEOUS | 2 refills | Status: DC

## 2017-03-02 NOTE — Progress Notes (Signed)
Post Partum Exam  Gina Hernandez is a 27 y.o. 431P1001 female who presents for a postpartum visit. She is 7 weeks postpartum following a spontaneous vaginal delivery. I have fully reviewed the prenatal and intrapartum course. The delivery was at 38 gestational weeks.  Anesthesia: epidural. Postpartum course has been complicated by anxiety. Baby's course has been doing well. Baby is feeding by both breast and bottle - Similac Gentle. Bleeding no bleeding. Bowel function is normal. Bladder function is normal. Patient is not sexually active. Contraception method is abstinence. Discuss BC, interested Pill. Postpartum depression screening:pos, score 15  The following portions of the patient's history were reviewed and updated as appropriate: allergies, current medications, past family history, past medical history, past social history, past surgical history and problem list.   Review of Systems Pertinent items noted in HPI and remainder of comprehensive ROS otherwise negative.    Objective:  Weight 259 lb (117.5 kg), unknown if currently breastfeeding.  General:  alert, cooperative and no distress  Lungs: clear to auscultation bilaterally  Heart:  regular rate and rhythm, S1, S2 normal, no murmur, click, rub or gallop  Abdomen: soft, non-tender; bowel sounds normal; no masses,  no organomegaly        Assessment:    Normal postpartum exam. Postpartum depression.   Plan:   1. Contraception: OCP (estrogen/progesterone) 2. Discussed counseling. Pt declined. Does not want medication. Feels like things will get better when she visits her mom. Discussed finding time for herself, roles for FOB, etc. Pt to return if not improving and especially if worsening. 3. Follow up in: 1 year or as needed.

## 2018-05-15 ENCOUNTER — Other Ambulatory Visit: Payer: Self-pay | Admitting: Family Medicine

## 2018-11-17 DIAGNOSIS — H01006 Unspecified blepharitis left eye, unspecified eyelid: Secondary | ICD-10-CM | POA: Diagnosis not present

## 2018-11-17 DIAGNOSIS — H01003 Unspecified blepharitis right eye, unspecified eyelid: Secondary | ICD-10-CM | POA: Diagnosis not present

## 2018-11-17 DIAGNOSIS — H1033 Unspecified acute conjunctivitis, bilateral: Secondary | ICD-10-CM | POA: Diagnosis not present

## 2019-03-07 ENCOUNTER — Encounter: Payer: BLUE CROSS/BLUE SHIELD | Admitting: Physician Assistant

## 2019-03-12 DIAGNOSIS — Z713 Dietary counseling and surveillance: Secondary | ICD-10-CM | POA: Diagnosis not present

## 2019-03-12 DIAGNOSIS — Z6838 Body mass index (BMI) 38.0-38.9, adult: Secondary | ICD-10-CM | POA: Diagnosis not present

## 2019-03-12 DIAGNOSIS — Z131 Encounter for screening for diabetes mellitus: Secondary | ICD-10-CM | POA: Diagnosis not present

## 2019-03-12 DIAGNOSIS — Z13220 Encounter for screening for lipoid disorders: Secondary | ICD-10-CM | POA: Diagnosis not present

## 2019-03-20 ENCOUNTER — Ambulatory Visit (INDEPENDENT_AMBULATORY_CARE_PROVIDER_SITE_OTHER): Payer: 59 | Admitting: Physician Assistant

## 2019-03-20 ENCOUNTER — Other Ambulatory Visit (HOSPITAL_COMMUNITY)
Admission: RE | Admit: 2019-03-20 | Discharge: 2019-03-20 | Disposition: A | Discharge status: Home / Self Care | Payer: 59 | Source: Ambulatory Visit | Attending: Physician Assistant | Admitting: Physician Assistant

## 2019-03-20 ENCOUNTER — Encounter: Payer: Self-pay | Admitting: Physician Assistant

## 2019-03-20 ENCOUNTER — Other Ambulatory Visit: Payer: Self-pay

## 2019-03-20 VITALS — BP 110/70 | HR 68 | Temp 98.7°F | Resp 16 | Ht 68.0 in | Wt 252.0 lb

## 2019-03-20 DIAGNOSIS — E669 Obesity, unspecified: Secondary | ICD-10-CM | POA: Diagnosis not present

## 2019-03-20 DIAGNOSIS — E282 Polycystic ovarian syndrome: Secondary | ICD-10-CM

## 2019-03-20 DIAGNOSIS — Z0000 Encounter for general adult medical examination without abnormal findings: Secondary | ICD-10-CM | POA: Diagnosis not present

## 2019-03-20 DIAGNOSIS — L680 Hirsutism: Secondary | ICD-10-CM

## 2019-03-20 DIAGNOSIS — R69 Illness, unspecified: Secondary | ICD-10-CM | POA: Diagnosis not present

## 2019-03-20 DIAGNOSIS — Z113 Encounter for screening for infections with a predominantly sexual mode of transmission: Secondary | ICD-10-CM | POA: Diagnosis not present

## 2019-03-20 LAB — COMPREHENSIVE METABOLIC PANEL
ALT: 25 U/L (ref 0–35)
AST: 28 U/L (ref 0–37)
Albumin: 3.9 g/dL (ref 3.5–5.2)
Alkaline Phosphatase: 54 U/L (ref 39–117)
BUN: 14 mg/dL (ref 6–23)
CO2: 25 mEq/L (ref 19–32)
Calcium: 9.2 mg/dL (ref 8.4–10.5)
Chloride: 107 mEq/L (ref 96–112)
Creatinine, Ser: 0.76 mg/dL (ref 0.40–1.20)
GFR: 90.16 mL/min (ref >60.00)
Glucose, Bld: 88 mg/dL (ref 70–99)
Potassium: 4.6 mEq/L (ref 3.5–5.1)
Sodium: 140 mEq/L (ref 135–145)
Total Bilirubin: 0.5 mg/dL (ref 0.2–1.2)
Total Protein: 7.1 g/dL (ref 6.0–8.3)

## 2019-03-20 LAB — CBC WITH DIFFERENTIAL/PLATELET
Basophils Absolute: 0 10*3/uL (ref 0.0–0.1)
Basophils Relative: 0.6 % (ref 0.0–3.0)
Eosinophils Absolute: 0.2 10*3/uL (ref 0.0–0.7)
Eosinophils Relative: 2.5 % (ref 0.0–5.0)
HCT: 41.2 % (ref 36.0–46.0)
Hemoglobin: 13.6 g/dL (ref 12.0–15.0)
Lymphocytes Relative: 26.2 % (ref 12.0–46.0)
Lymphs Abs: 1.9 10*3/uL (ref 0.7–4.0)
MCHC: 32.9 g/dL (ref 30.0–36.0)
MCV: 86.9 fl (ref 78.0–100.0)
Monocytes Absolute: 0.5 10*3/uL (ref 0.1–1.0)
Monocytes Relative: 7.4 % (ref 3.0–12.0)
Neutro Abs: 4.5 10*3/uL (ref 1.4–7.7)
Neutrophils Relative %: 63.3 % (ref 43.0–77.0)
Platelets: 273 10*3/uL (ref 150.0–400.0)
RBC: 4.74 Mil/uL (ref 3.87–5.11)
RDW: 14.5 % (ref 11.5–15.5)
WBC: 7.2 10*3/uL (ref 4.0–10.5)

## 2019-03-20 LAB — LIPID PANEL
Cholesterol: 152 mg/dL (ref 0–200)
HDL: 51.6 mg/dL (ref >39.00)
LDL Cholesterol: 85 mg/dL (ref 0–99)
NonHDL: 100.35
Total CHOL/HDL Ratio: 3
Triglycerides: 79 mg/dL (ref 0.0–149.0)
VLDL: 15.8 mg/dL (ref 0.0–40.0)

## 2019-03-20 LAB — TSH: TSH: 0.59 u[IU]/mL (ref 0.35–4.50)

## 2019-03-20 LAB — POCT URINE PREGNANCY: Preg Test, Ur: NEGATIVE

## 2019-03-20 MED ORDER — LO LOESTRIN FE 1 MG-10 MCG / 10 MCG PO TABS: 1.0000 | ORAL_TABLET | Freq: Every day | ORAL | 11 refills | Status: DC

## 2019-03-20 MED ORDER — SPIRONOLACTONE 25 MG PO TABS: 12.5000 mg | ORAL_TABLET | Freq: Every day | ORAL | 0 refills | Status: DC

## 2019-03-20 NOTE — Progress Notes (Signed)
Patient presents to clinic today for annual exam.  Patient is fasting for labs.   Is requesting routine STI check. Sexually active with 1 female partner, not consistent use of condoms. Denies symptoms.   Diet -- Trying to keep a well-balanced diet. Drinks plenty of water.   Chronic Issues: Patient with PCOS -- Would like to restart spironolactone after the birth of child. Would also like to restart her OCPs. Is not currently sexually active. Not breast feeding. Has significant issue with acne resistance to other medications.  Health Maintenance: Immunizations -- TDaP up-to-date. Declines flu shot.  PAP -- Followed by GYN. Up-to-date per patient. Will obtain records.   Past Medical History:  Diagnosis Date  . Anxiety   . History of chickenpox   . PCOS (polycystic ovarian syndrome)     Past Surgical History:  Procedure Laterality Date  . CHOLECYSTECTOMY N/A 06/27/2014   Procedure: LAPAROSCOPIC CHOLECYSTECTOMY ;  Surgeon: Violeta Gelinas, MD;  Location: [facility] OR;  Service: General;  Laterality: N/A;  . galbladder    . WISDOM TOOTH EXTRACTION      Current Outpatient Medications on File Prior to Visit  Medication Sig Dispense Refill  . LO LOESTRIN FE 1 MG-10 MCG / 10 MCG tablet TAKE 1 TABLET BY MOUTH EVERY DAY (Patient not taking: Reported on 03/20/2019) 28 tablet 11   No current facility-administered medications on file prior to visit.    No Known Allergies  Family History  Problem Relation Age of Onset  . Cancer Neg Hx   . Birth defects Neg Hx   . Diabetes Neg Hx   . Hypertension Neg Hx   . Stroke Neg Hx     Social History   Socioeconomic History  . Marital status: Single    Spouse name: Not on file  . Number of children: 0  . Years of education: Not on file  . Highest education level: Not on file  Occupational History  . Not on file  Tobacco Use  . Smoking status: Never Smoker  . Smokeless tobacco: Never Used  Substance and Sexual Activity  . Alcohol use: No    . Drug use: No  . Sexual activity: Yes    Partners: Male    Birth control/protection: Condom  Other Topics Concern  . Not on file  Social History Narrative  . Not on file   Social Determinants of Health   Financial Resource Strain:   . Difficulty of Paying Living Expenses:   Food Insecurity:   . Worried About Programme researcher, broadcasting/film/video in the Last Year:   . Barista in the Last Year:   Transportation Needs:   . Freight forwarder (Medical):   Marland Kitchen Lack of Transportation (Non-Medical):   Physical Activity:   . Days of Exercise per Week:   . Minutes of Exercise per Session:   Stress:   . Feeling of Stress :   Social Connections:   . Frequency of Communication with Friends and Family:   . Frequency of Social Gatherings with Friends and Family:   . Attends Religious Services:   . Active Member of Clubs or Organizations:   . Attends Banker Meetings:   Marland Kitchen Marital Status:   Intimate Partner Violence:   . Fear of Current or Ex-Partner:   . Emotionally Abused:   Marland Kitchen Physically Abused:   . Sexually Abused:    Review of Systems  Constitutional: Negative for fever and weight loss.  HENT: Negative for  ear discharge, ear pain, hearing loss and tinnitus.   Eyes: Negative for blurred vision, double vision, photophobia and pain.  Respiratory: Negative for cough and shortness of breath.   Cardiovascular: Negative for chest pain and palpitations.  Gastrointestinal: Negative for abdominal pain, blood in stool, constipation, diarrhea, heartburn, melena, nausea and vomiting.  Genitourinary: Negative for dysuria, flank pain, frequency, hematuria and urgency.  Musculoskeletal: Negative for falls.  Neurological: Negative for dizziness, loss of consciousness and headaches.  Endo/Heme/Allergies: Negative for environmental allergies.  Psychiatric/Behavioral: Negative for depression, hallucinations, substance abuse and suicidal ideas. The patient is not nervous/anxious and does not  have insomnia.    BP 110/70   Pulse 68   Temp 98.7 F (37.1 C) (Temporal)   Resp 16   Ht 5\' 8"  (1.727 m)   Wt 252 lb (114.3 kg)   SpO2 98%   Breastfeeding No   BMI 38.32 kg/m   Physical Exam Vitals reviewed.  HENT:     Head: Normocephalic and atraumatic.     Right Ear: Tympanic membrane, ear canal and external ear normal.     Left Ear: Tympanic membrane, ear canal and external ear normal.     Nose: Nose normal. No mucosal edema.     Mouth/Throat:     Pharynx: Uvula midline. No oropharyngeal exudate or posterior oropharyngeal erythema.  Eyes:     Conjunctiva/sclera: Conjunctivae normal.     Pupils: Pupils are equal, round, and reactive to light.  Neck:     Thyroid: No thyromegaly.  Cardiovascular:     Rate and Rhythm: Normal rate and regular rhythm.     Heart sounds: Normal heart sounds.  Pulmonary:     Effort: Pulmonary effort is normal. No respiratory distress.     Breath sounds: Normal breath sounds. No wheezing or rales.  Abdominal:     General: Bowel sounds are normal. There is no distension.     Palpations: Abdomen is soft. There is no mass.     Tenderness: There is no abdominal tenderness. There is no guarding or rebound.  Musculoskeletal:     Cervical back: Neck supple.  Lymphadenopathy:     Cervical: No cervical adenopathy.  Skin:    General: Skin is warm and dry.     Findings: No rash.     Comments: Female facial hirsutism noted   Neurological:     Mental Status: She is alert and oriented to person, place, and time.     Cranial Nerves: No cranial nerve deficit.    Assessment/Plan: 1. Visit for preventive health examination Depression screen negative. Health Maintenance reviewed -- Declines flu shot. Will obtain records for PAP. Preventive schedule discussed and handout given in AVS. Will obtain fasting labs today.  - CBC with Differential/Platelet - Comprehensive metabolic panel - Lipid panel - TSH  2. Screen for STD (sexually transmitted  disease) Asymptomatic. Lab orders placed. Safe sex practices reviewed.  - HSV(herpes smplx)abs-1+2(IgG+IgM)-bld - RPR - HIV Antibody (routine testing w rflx) - Urine cytology ancillary only([place])  3. Obesity (BMI 35.0-39.9 without comorbidity) Dietary and exercise recommendations reviewed with patient. Will check fasting labs today. - Comprehensive metabolic panel - Lipid panel - TSH  4. PCOS (polycystic ovarian syndrome) 5. Female hirsutism Wanting to restart her spironolactone and OCPs. Urine pregnancy negative. Will restart her Lo Loestrin. Will start spironolactone 12.5 mg once daily. Follow-up scheduled to reassess BP levels. Will monitor facial hair over the next 3-6 months.  - Comprehensive metabolic panel - POCT urine pregnancy  Leeanne Rio, PA-C

## 2019-03-20 NOTE — Patient Instructions (Addendum)
Please go to the lab for blood work.   Our office will call you with your results unless you have chosen to receive results via MyChart.  If your blood work is normal we will follow-up each year for physicals and as scheduled for chronic medical problems.  If anything is abnormal we will treat accordingly and get you in for a follow-up.  Start the spironolactone taking daily as directed. Keep well-hydrated. Follow-up with me in 3-4 weeks to reassess BP.  I will work on the letter for you that we discussed.  I will let you know as soon as it is ready.   Preventive Care 29-29 Years Old, Female Preventive care refers to visits with your health care provider and lifestyle choices that can promote health and wellness. This includes:  A yearly physical exam. This may also be called an annual well check.  Regular dental visits and eye exams.  Immunizations.  Screening for certain conditions.  Healthy lifestyle choices, such as eating a healthy diet, getting regular exercise, not using drugs or products that contain nicotine and tobacco, and limiting alcohol use. What can I expect for my preventive care visit? Physical exam Your health care provider will check your:  Height and weight. This may be used to calculate body mass index (BMI), which tells if you are at a healthy weight.  Heart rate and blood pressure.  Skin for abnormal spots. Counseling Your health care provider may ask you questions about your:  Alcohol, tobacco, and drug use.  Emotional well-being.  Home and relationship well-being.  Sexual activity.  Eating habits.  Work and work Statistician.  Method of birth control.  Menstrual cycle.  Pregnancy history. What immunizations do I need?  Influenza (flu) vaccine  This is recommended every year. Tetanus, diphtheria, and pertussis (Tdap) vaccine  You may need a Td booster every 10 years. Varicella (chickenpox) vaccine  You may need this if you have  not been vaccinated. Human papillomavirus (HPV) vaccine  If recommended by your health care provider, you may need three doses over 6 months. Measles, mumps, and rubella (MMR) vaccine  You may need at least one dose of MMR. You may also need a second dose. Meningococcal conjugate (MenACWY) vaccine  One dose is recommended if you are age 44-21 years and a first-year college student living in a residence hall, or if you have one of several medical conditions. You may also need additional booster doses. Pneumococcal conjugate (PCV13) vaccine  You may need this if you have certain conditions and were not previously vaccinated. Pneumococcal polysaccharide (PPSV23) vaccine  You may need one or two doses if you smoke cigarettes or if you have certain conditions. Hepatitis A vaccine  You may need this if you have certain conditions or if you travel or work in places where you may be exposed to hepatitis A. Hepatitis B vaccine  You may need this if you have certain conditions or if you travel or work in places where you may be exposed to hepatitis B. Haemophilus influenzae type b (Hib) vaccine  You may need this if you have certain conditions. You may receive vaccines as individual doses or as more than one vaccine together in one shot (combination vaccines). Talk with your health care provider about the risks and benefits of combination vaccines. What tests do I need?  Blood tests  Lipid and cholesterol levels. These may be checked every 5 years starting at age 26.  Hepatitis C test.  Hepatitis B test.  Screening  Diabetes screening. This is done by checking your blood sugar (glucose) after you have not eaten for a while (fasting).  Sexually transmitted disease (STD) testing.  BRCA-related cancer screening. This may be done if you have a family history of breast, ovarian, tubal, or peritoneal cancers.  Pelvic exam and Pap test. This may be done every 3 years starting at age 69.  Starting at age 42, this may be done every 5 years if you have a Pap test in combination with an HPV test. Talk with your health care provider about your test results, treatment options, and if necessary, the need for more tests. Follow these instructions at home: Eating and drinking   Eat a diet that includes fresh fruits and vegetables, whole grains, lean protein, and low-fat dairy.  Take vitamin and mineral supplements as recommended by your health care provider.  Do not drink alcohol if: ? Your health care provider tells you not to drink. ? You are pregnant, may be pregnant, or are planning to become pregnant.  If you drink alcohol: ? Limit how much you have to 0-1 drink a day. ? Be aware of how much alcohol is in your drink. In the [place] one drink equals one 12 oz bottle of beer (355 mL), one 5 oz glass of wine (148 mL), or one 1 oz glass of hard liquor (44 mL). Lifestyle  Take daily care of your teeth and gums.  Stay active. Exercise for at least 30 minutes on 5 or more days each week.  Do not use any products that contain nicotine or tobacco, such as cigarettes, e-cigarettes, and chewing tobacco. If you need help quitting, ask your health care provider.  If you are sexually active, practice safe sex. Use a condom or other form of birth control (contraception) in order to prevent pregnancy and STIs (sexually transmitted infections). If you plan to become pregnant, see your health care provider for a preconception visit. What's next?  Visit your health care provider once a year for a well check visit.  Ask your health care provider how often you should have your eyes and teeth checked.  Stay up to date on all vaccines. This information is not intended to replace advice given to you by your health care provider. Make sure you discuss any questions you have with your health care provider. Document Revised: 09/06/2017 Document Reviewed: 09/06/2017 Elsevier Patient Education   2020 [employer].

## 2019-03-21 LAB — RPR: RPR Ser Ql: NONREACTIVE

## 2019-03-21 LAB — HIV ANTIBODY (ROUTINE TESTING W REFLEX): HIV 1&2 Ab, 4th Generation: NONREACTIVE

## 2019-03-22 LAB — HSV(HERPES SMPLX)ABS-I+II(IGG+IGM)-BLD
HSV 1 Glycoprotein G Ab, IgG: 40.7 index — ABNORMAL HIGH (ref 0.00–0.90)
HSV 2 IgG, Type Spec: <0.91 index (ref 0.00–0.90)
HSVI/II Comb IgM: <0.91 Ratio (ref 0.00–0.90)

## 2019-03-24 ENCOUNTER — Telehealth: Payer: Self-pay | Admitting: Physician Assistant

## 2019-03-24 MED ORDER — LO LOESTRIN FE 1 MG-10 MCG / 10 MCG PO TABS: 1.0000 | ORAL_TABLET | Freq: Every day | ORAL | 11 refills | Status: DC

## 2019-03-24 MED ORDER — SPIRONOLACTONE 25 MG PO TABS: 12.5000 mg | ORAL_TABLET | Freq: Every day | ORAL | 0 refills | Status: DC

## 2019-03-24 NOTE — Telephone Encounter (Signed)
Pt called stating she requested for both Loestrin FE & Aldactone 25 MG medications to be refilled. Refills were sent to [facility]. Pt called stating her insurance will no longer cover through [facility]. Pt is requesting for prescriptions to be sent to [facility] on [address]. Please advise.

## 2019-03-24 NOTE — Telephone Encounter (Signed)
Medications resent to new pharmacy as requested 

## 2019-03-26 ENCOUNTER — Other Ambulatory Visit: Payer: Self-pay | Admitting: Emergency Medicine

## 2019-03-26 DIAGNOSIS — N760 Acute vaginitis: Secondary | ICD-10-CM

## 2019-03-26 DIAGNOSIS — B9689 Other specified bacterial agents as the cause of diseases classified elsewhere: Secondary | ICD-10-CM

## 2019-03-26 LAB — URINE CYTOLOGY ANCILLARY ONLY
Bacterial Vaginitis-Urine: POSITIVE — AB
Bacterial Vaginitis-Urine: POSITIVE — AB
Bacterial Vaginitis-Urine: POSITIVE — AB
Bacterial Vaginitis-Urine: POSITIVE — AB
Candida Urine: NEGATIVE
Chlamydia: NEGATIVE
Comment: NEGATIVE
Comment: NEGATIVE
Comment: NORMAL
Neisseria Gonorrhea: NEGATIVE
Trichomonas: NEGATIVE

## 2019-03-26 MED ORDER — METRONIDAZOLE 500 MG PO TABS: 500.0000 mg | ORAL_TABLET | Freq: Two times a day (BID) | ORAL | 0 refills | Status: AC

## 2019-04-16 ENCOUNTER — Other Ambulatory Visit: Payer: Self-pay | Admitting: Physician Assistant

## 2019-06-18 ENCOUNTER — Other Ambulatory Visit: Payer: Self-pay | Admitting: Physician Assistant

## 2019-06-19 NOTE — Telephone Encounter (Signed)
Spironolactone last filled 03/20/19. Patient was supposed to follow up in 3-4 weeks. Patient has no follow up scheduled.

## 2019-11-13 DIAGNOSIS — S90931A Unspecified superficial injury of right great toe, initial encounter: Secondary | ICD-10-CM | POA: Diagnosis not present

## 2019-11-13 DIAGNOSIS — S90212A Contusion of left great toe with damage to nail, initial encounter: Secondary | ICD-10-CM | POA: Diagnosis not present

## 2019-11-13 DIAGNOSIS — S92422A Displaced fracture of distal phalanx of left great toe, initial encounter for closed fracture: Secondary | ICD-10-CM | POA: Diagnosis not present

## 2020-07-01 ENCOUNTER — Encounter: Payer: Self-pay | Admitting: Registered Nurse

## 2020-07-01 ENCOUNTER — Ambulatory Visit (INDEPENDENT_AMBULATORY_CARE_PROVIDER_SITE_OTHER): Payer: 59 | Admitting: Registered Nurse

## 2020-07-01 ENCOUNTER — Other Ambulatory Visit: Payer: Self-pay

## 2020-07-01 ENCOUNTER — Other Ambulatory Visit (HOSPITAL_COMMUNITY)
Admission: RE | Admit: 2020-07-01 | Discharge: 2020-07-01 | Disposition: A | Discharge status: Home / Self Care | Payer: 59 | Source: Ambulatory Visit | Attending: Registered Nurse | Admitting: Registered Nurse

## 2020-07-01 VITALS — BP 116/75 | HR 75 | Temp 98.3°F | Resp 18 | Ht 68.0 in | Wt 269.6 lb

## 2020-07-01 DIAGNOSIS — E559 Vitamin D deficiency, unspecified: Secondary | ICD-10-CM | POA: Diagnosis not present

## 2020-07-01 DIAGNOSIS — Z124 Encounter for screening for malignant neoplasm of cervix: Secondary | ICD-10-CM | POA: Diagnosis present

## 2020-07-01 DIAGNOSIS — Z0000 Encounter for general adult medical examination without abnormal findings: Secondary | ICD-10-CM

## 2020-07-01 DIAGNOSIS — Z13220 Encounter for screening for lipoid disorders: Secondary | ICD-10-CM

## 2020-07-01 DIAGNOSIS — E669 Obesity, unspecified: Secondary | ICD-10-CM

## 2020-07-01 DIAGNOSIS — Z13228 Encounter for screening for other metabolic disorders: Secondary | ICD-10-CM

## 2020-07-01 DIAGNOSIS — Z7689 Persons encountering health services in other specified circumstances: Secondary | ICD-10-CM | POA: Diagnosis not present

## 2020-07-01 DIAGNOSIS — Z130 Encounter for screening for diseases of the blood and blood-forming organs and certain disorders involving the immune mechanism: Secondary | ICD-10-CM

## 2020-07-01 DIAGNOSIS — Z113 Encounter for screening for infections with a predominantly sexual mode of transmission: Secondary | ICD-10-CM

## 2020-07-01 DIAGNOSIS — Z1329 Encounter for screening for other suspected endocrine disorder: Secondary | ICD-10-CM

## 2020-07-01 MED ORDER — PHENTERMINE HCL 15 MG PO CAPS: 15.0000 mg | ORAL_CAPSULE | ORAL | 0 refills | Status: AC

## 2020-07-01 NOTE — Progress Notes (Signed)
Established Patient Office Visit  Subjective:  Patient ID: Gina Hernandez, female    DOB: 24-Feb-1990  Age: 30 y.o. MRN: [id number]  CC:  Chief Complaint  Patient presents with   Transitions Of Care    Patient states she is here for a TOC and CPE.    HPI Gina Hernandez presents for TOC and CPE  Concern for weight. BMI 40.99 today Has been trying to pursue diet and exercise with mixed results Hx of PCOS has been making her goals more difficult. Would be interested in medication.  Histories reviewed and updated with patient.   Last pap in 2019 - due this year. No hx of abnormal. Unfortunately has been lost to follow up with Gyn. Would like to do pap today.  Requesting routine STI screening today. In long term monogamous relationship, no symptoms.  Past Medical History:  Diagnosis Date   Anxiety    History of chickenpox    PCOS (polycystic ovarian syndrome)     Past Surgical History:  Procedure Laterality Date   CHOLECYSTECTOMY N/A 06/27/2014   Procedure: LAPAROSCOPIC CHOLECYSTECTOMY ;  Surgeon: Violeta Gelinas, MD;  Location: [facility] OR;  Service: General;  Laterality: N/A;   galbladder     WISDOM TOOTH EXTRACTION      Family History  Problem Relation Age of Onset   Cancer Neg Hx    Birth defects Neg Hx    Diabetes Neg Hx    Hypertension Neg Hx    Stroke Neg Hx     Social History   Socioeconomic History   Marital status: Single    Spouse name: Not on file   Number of children: 0   Years of education: Not on file   Highest education level: Not on file  Occupational History   Not on file  Tobacco Use   Smoking status: Never   Smokeless tobacco: Never  Vaping Use   Vaping Use: Never used  Substance and Sexual Activity   Alcohol use: No   Drug use: No   Sexual activity: Yes    Partners: Male    Birth control/protection: Condom  Other Topics Concern   Not on file  Social History Narrative   Not on file   Social Determinants of Health   Financial  Resource Strain: Not on file  Food Insecurity: Not on file  Transportation Needs: Not on file  Physical Activity: Not on file  Stress: Not on file  Social Connections: Not on file  Intimate Partner Violence: Not on file    Outpatient Medications Prior to Visit  Medication Sig Dispense Refill   LO LOESTRIN FE 1 MG-10 MCG / 10 MCG tablet TAKE 1 TABLET BY MOUTH EVERY DAY (Patient not taking: Reported on 07/01/2020) 84 tablet 4   spironolactone (ALDACTONE) 25 MG tablet Take 0.5 tablets (12.5 mg total) by mouth daily. (Patient not taking: Reported on 07/01/2020) 45 tablet 0   No facility-administered medications prior to visit.    No Known Allergies  ROS Review of Systems  Constitutional: Negative.   HENT: Negative.    Eyes: Negative.   Respiratory: Negative.    Cardiovascular: Negative.   Gastrointestinal: Negative.   Genitourinary: Negative.   Musculoskeletal: Negative.   Skin: Negative.   Neurological: Negative.   Psychiatric/Behavioral: Negative.    All other systems reviewed and are negative.    Objective:    Physical Exam Vitals and nursing note reviewed. Exam conducted with a chaperone present.  Constitutional:  General: She is not in acute distress.    Appearance: Normal appearance. She is normal weight. She is not ill-appearing, toxic-appearing or diaphoretic.  HENT:     Head: Normocephalic and atraumatic.     Right Ear: Tympanic membrane, ear canal and external ear normal. There is no impacted cerumen.     Left Ear: Tympanic membrane, ear canal and external ear normal. There is no impacted cerumen.     Nose: Nose normal. No congestion or rhinorrhea.     Mouth/Throat:     Mouth: Mucous membranes are moist.     Pharynx: Oropharynx is clear. No oropharyngeal exudate or posterior oropharyngeal erythema.  Eyes:     General: No scleral icterus.       Right eye: No discharge.        Left eye: No discharge.     Extraocular Movements: Extraocular movements intact.      Conjunctiva/sclera: Conjunctivae normal.     Pupils: Pupils are equal, round, and reactive to light.  Cardiovascular:     Rate and Rhythm: Normal rate and regular rhythm.     Pulses: Normal pulses.     Heart sounds: Normal heart sounds. No murmur heard.   No friction rub. No gallop.  Pulmonary:     Effort: Pulmonary effort is normal. No respiratory distress.     Breath sounds: Normal breath sounds. No stridor. No wheezing, rhonchi or rales.  Chest:     Chest wall: No tenderness.  Abdominal:     General: Abdomen is flat. Bowel sounds are normal. There is no distension.     Palpations: Abdomen is soft. There is no mass.     Tenderness: There is no abdominal tenderness. There is no right CVA tenderness, left CVA tenderness, guarding or rebound.     Hernia: No hernia is present.  Genitourinary:    General: Normal vulva.     Exam position: Knee-chest position.     Pubic Area: No rash or pubic lice.      Labia:        Right: No rash, tenderness, lesion or injury.        Left: No rash, tenderness, lesion or injury.      Urethra: No prolapse or urethral pain.     Vagina: Normal. No vaginal discharge.     Cervix: Normal.     Uterus: Normal.      Adnexa: Right adnexa normal and left adnexa normal.  Musculoskeletal:        General: No swelling, tenderness, deformity or signs of injury. Normal range of motion.     Right lower leg: No edema.     Left lower leg: No edema.  Skin:    General: Skin is warm and dry.     Capillary Refill: Capillary refill takes less than 2 seconds.     Coloration: Skin is not jaundiced or pale.     Findings: No bruising, erythema, lesion or rash.  Neurological:     General: No focal deficit present.     Mental Status: She is alert and oriented to person, place, and time. Mental status is at baseline.     Cranial Nerves: No cranial nerve deficit.     Sensory: No sensory deficit.     Motor: No weakness.     Coordination: Coordination normal.     Gait:  Gait normal.     Deep Tendon Reflexes: Reflexes normal.  Psychiatric:        Mood and Affect: Mood  normal.        Behavior: Behavior normal.        Thought Content: Thought content normal.        Judgment: Judgment normal.    BP 116/75   Pulse 75   Temp 98.3 F (36.8 C) (Temporal)   Resp 18   Ht 5\' 8"  (1.727 m)   Wt 269 lb 9.6 oz (122.3 kg)   SpO2 99%   BMI 40.99 kg/m  Wt Readings from Last 3 Encounters:  07/01/20 269 lb 9.6 oz (122.3 kg)  03/20/19 252 lb (114.3 kg)  03/02/17 259 lb (117.5 kg)     There are no preventive care reminders to display for this patient.  There are no preventive care reminders to display for this patient.  Lab Results  Component Value Date   TSH 0.59 03/20/2019   Lab Results  Component Value Date   WBC 7.2 03/20/2019   HGB 13.6 03/20/2019   HCT 41.2 03/20/2019   MCV 86.9 03/20/2019   PLT 273.0 03/20/2019   Lab Results  Component Value Date   NA 140 03/20/2019   K 4.6 03/20/2019   CO2 25 03/20/2019   GLUCOSE 88 03/20/2019   BUN 14 03/20/2019   CREATININE 0.76 03/20/2019   BILITOT 0.5 03/20/2019   ALKPHOS 54 03/20/2019   AST 28 03/20/2019   ALT 25 03/20/2019   PROT 7.1 03/20/2019   ALBUMIN 3.9 03/20/2019   CALCIUM 9.2 03/20/2019   ANIONGAP 10 01/08/2017   GFR 90.16 03/20/2019   Lab Results  Component Value Date   CHOL 152 03/20/2019   Lab Results  Component Value Date   HDL 51.60 03/20/2019   Lab Results  Component Value Date   LDLCALC 85 03/20/2019   Lab Results  Component Value Date   TRIG 79.0 03/20/2019   Lab Results  Component Value Date   CHOLHDL 3 03/20/2019   Lab Results  Component Value Date   HGBA1C 5.8 05/11/2016      Assessment & Plan:   Problem List Items Addressed This Visit       Other   Obesity (BMI 35.0-39.9 without comorbidity)   Relevant Medications   phentermine 15 MG capsule   Vitamin D deficiency   Relevant Orders   Vitamin D (25 hydroxy)   Other Visit Diagnoses      Annual physical exam    -  Primary   Encounter to establish care       Screening for endocrine, metabolic and immunity disorder       Relevant Orders   CBC with Differential/Platelet   Comprehensive metabolic panel   Hemoglobin A1c   TSH   Lipid screening       Relevant Orders   Lipid panel   Papanicolaou smear       Relevant Orders   Cytology - PAP( [place])   Routine screening for STI (sexually transmitted infection)       Relevant Orders   HIV antibody (with reflex)   RPR       Meds ordered this encounter  Medications   phentermine 15 MG capsule    Sig: Take 1 capsule (15 mg total) by mouth every morning.    Dispense:  30 capsule    Refill:  0    Order Specific Question:   Supervising Provider    Answer:   12-09-1989, JEFFREY R [[id number]]    Follow-up: No follow-ups on file.   PLAN Exam unremarkable Labs collected. Will follow  up with the patient as warranted. Pap sent - follow up as warranted. If wnl, repeat in 3 years w HPV cotesting. STI screening sent Start phentermine 15mg  PO qd. Discussed r/b/se of this medication. Encouraged continued diet and exercise. Patient encouraged to call clinic with any questions, comments, or concerns.  , NP

## 2020-07-01 NOTE — Patient Instructions (Addendum)
Ms. Tishawna Larouche to meet you!  No concerns on exam today.  Let's see how lab work looks.  I'll call with any concerns.  Thanks,  Rich     If you have lab work done today you will be contacted with your lab results within the next 2 weeks.  If you have not heard from [place] then please contact us. The fastest way to get your results is to register for My Chart.   IF you received an x-ray today, you will receive an invoice from [facility]. Please contact [facility] at [phone] with questions or concerns regarding your invoice.   IF you received labwork today, you will receive an invoice from [place]. Please contact [facility] at [phone] with questions or concerns regarding your invoice.   Our billing staff will not be able to assist you with questions regarding bills from these companies.  You will be contacted with the lab results as soon as they are available. The fastest way to get your results is to activate your My Chart account. Instructions are located on the last page of this paperwork. If you have not heard from [place] regarding the results in 2 weeks, please contact this office.

## 2020-07-02 LAB — CYTOLOGY - PAP
Adequacy: ABSENT
Chlamydia: NEGATIVE
Comment: NEGATIVE
Comment: NEGATIVE
Comment: NORMAL
Diagnosis: NEGATIVE
Neisseria Gonorrhea: NEGATIVE
Trichomonas: NEGATIVE

## 2020-07-06 ENCOUNTER — Encounter: Payer: Self-pay | Admitting: Registered Nurse

## 2020-09-23 ENCOUNTER — Encounter (HOSPITAL_COMMUNITY): Payer: Self-pay

## 2020-09-23 ENCOUNTER — Emergency Department (HOSPITAL_COMMUNITY): Payer: BC Managed Care – PPO

## 2020-09-23 ENCOUNTER — Other Ambulatory Visit: Payer: Self-pay

## 2020-09-23 ENCOUNTER — Emergency Department (HOSPITAL_COMMUNITY)
Admission: EM | Admit: 2020-09-23 | Discharge: 2020-09-23 | Disposition: A | Discharge status: Home / Self Care | Payer: BC Managed Care – PPO | Attending: Emergency Medicine | Admitting: Emergency Medicine

## 2020-09-23 DIAGNOSIS — S301XXA Contusion of abdominal wall, initial encounter: Secondary | ICD-10-CM | POA: Insufficient documentation

## 2020-09-23 DIAGNOSIS — S80811A Abrasion, right lower leg, initial encounter: Secondary | ICD-10-CM | POA: Diagnosis not present

## 2020-09-23 DIAGNOSIS — Y900 Blood alcohol level of less than 20 mg/100 ml: Secondary | ICD-10-CM | POA: Diagnosis not present

## 2020-09-23 DIAGNOSIS — Z041 Encounter for examination and observation following transport accident: Secondary | ICD-10-CM | POA: Diagnosis not present

## 2020-09-23 DIAGNOSIS — Z743 Need for continuous supervision: Secondary | ICD-10-CM | POA: Diagnosis not present

## 2020-09-23 DIAGNOSIS — M542 Cervicalgia: Secondary | ICD-10-CM | POA: Diagnosis not present

## 2020-09-23 DIAGNOSIS — R0789 Other chest pain: Secondary | ICD-10-CM | POA: Insufficient documentation

## 2020-09-23 DIAGNOSIS — S199XXA Unspecified injury of neck, initial encounter: Secondary | ICD-10-CM | POA: Diagnosis not present

## 2020-09-23 DIAGNOSIS — S161XXA Strain of muscle, fascia and tendon at neck level, initial encounter: Secondary | ICD-10-CM | POA: Diagnosis not present

## 2020-09-23 DIAGNOSIS — S80812A Abrasion, left lower leg, initial encounter: Secondary | ICD-10-CM | POA: Diagnosis not present

## 2020-09-23 DIAGNOSIS — S299XXA Unspecified injury of thorax, initial encounter: Secondary | ICD-10-CM | POA: Diagnosis not present

## 2020-09-23 DIAGNOSIS — R109 Unspecified abdominal pain: Secondary | ICD-10-CM | POA: Diagnosis not present

## 2020-09-23 DIAGNOSIS — Z79899 Other long term (current) drug therapy: Secondary | ICD-10-CM | POA: Diagnosis not present

## 2020-09-23 DIAGNOSIS — S3991XA Unspecified injury of abdomen, initial encounter: Secondary | ICD-10-CM | POA: Diagnosis not present

## 2020-09-23 DIAGNOSIS — M79662 Pain in left lower leg: Secondary | ICD-10-CM | POA: Diagnosis not present

## 2020-09-23 DIAGNOSIS — N9489 Other specified conditions associated with female genital organs and menstrual cycle: Secondary | ICD-10-CM | POA: Diagnosis not present

## 2020-09-23 DIAGNOSIS — Y92410 Unspecified street and highway as the place of occurrence of the external cause: Secondary | ICD-10-CM | POA: Insufficient documentation

## 2020-09-23 DIAGNOSIS — T07XXXA Unspecified multiple injuries, initial encounter: Secondary | ICD-10-CM | POA: Diagnosis not present

## 2020-09-23 DIAGNOSIS — M79661 Pain in right lower leg: Secondary | ICD-10-CM | POA: Diagnosis not present

## 2020-09-23 DIAGNOSIS — E041 Nontoxic single thyroid nodule: Secondary | ICD-10-CM

## 2020-09-23 LAB — I-STAT CHEM 8, ED
BUN: 11 mg/dL (ref 6–20)
Calcium, Ion: 1.17 mmol/L (ref 1.15–1.40)
Chloride: 106 mmol/L (ref 98–111)
Creatinine, Ser: 1 mg/dL (ref 0.44–1.00)
Glucose, Bld: 102 mg/dL — ABNORMAL HIGH (ref 70–99)
HCT: 42 % (ref 36.0–46.0)
Hemoglobin: 14.3 g/dL (ref 12.0–15.0)
Potassium: 4.4 mmol/L (ref 3.5–5.1)
Sodium: 140 mmol/L (ref 135–145)
TCO2: 26 mmol/L (ref 22–32)

## 2020-09-23 LAB — CBC
HCT: 44.5 % (ref 36.0–46.0)
Hemoglobin: 14.4 g/dL (ref 12.0–15.0)
MCH: 28.5 pg (ref 26.0–34.0)
MCHC: 32.4 g/dL (ref 30.0–36.0)
MCV: 88.1 fL (ref 80.0–100.0)
Platelets: 275 10*3/uL (ref 150–400)
RBC: 5.05 MIL/uL (ref 3.87–5.11)
RDW: 13.5 % (ref 11.5–15.5)
WBC: 13.7 10*3/uL — ABNORMAL HIGH (ref 4.0–10.5)
nRBC: 0 % (ref 0.0–0.2)

## 2020-09-23 LAB — URINALYSIS, ROUTINE W REFLEX MICROSCOPIC
Bacteria, UA: NONE SEEN
Bilirubin Urine: NEGATIVE
Glucose, UA: NEGATIVE mg/dL
Ketones, ur: NEGATIVE mg/dL
Leukocytes,Ua: NEGATIVE
Nitrite: NEGATIVE
Protein, ur: NEGATIVE mg/dL
Specific Gravity, Urine: 1.014 (ref 1.005–1.030)
pH: 6 (ref 5.0–8.0)

## 2020-09-23 LAB — COMPREHENSIVE METABOLIC PANEL
ALT: 24 U/L (ref 0–44)
AST: 25 U/L (ref 15–41)
Albumin: 4 g/dL (ref 3.5–5.0)
Alkaline Phosphatase: 55 U/L (ref 38–126)
Anion gap: 9 (ref 5–15)
BUN: 13 mg/dL (ref 6–20)
CO2: 26 mmol/L (ref 22–32)
Calcium: 9 mg/dL (ref 8.9–10.3)
Chloride: 103 mmol/L (ref 98–111)
Creatinine, Ser: 0.92 mg/dL (ref 0.44–1.00)
GFR, Estimated: >60 mL/min (ref >60)
Glucose, Bld: 103 mg/dL — ABNORMAL HIGH (ref 70–99)
Potassium: 3.9 mmol/L (ref 3.5–5.1)
Sodium: 138 mmol/L (ref 135–145)
Total Bilirubin: 1 mg/dL (ref 0.3–1.2)
Total Protein: 7.8 g/dL (ref 6.5–8.1)

## 2020-09-23 LAB — I-STAT BETA HCG BLOOD, ED (MC, WL, AP ONLY): I-stat hCG, quantitative: <5 m[IU]/mL (ref <5)

## 2020-09-23 LAB — LACTIC ACID, PLASMA: Lactic Acid, Venous: 1.1 mmol/L (ref 0.5–1.9)

## 2020-09-23 LAB — PROTIME-INR
INR: 1 (ref 0.8–1.2)
Prothrombin Time: 12.8 seconds (ref 11.4–15.2)

## 2020-09-23 LAB — ETHANOL: Alcohol, Ethyl (B): <10 mg/dL (ref <10)

## 2020-09-23 MED ORDER — FENTANYL CITRATE PF 50 MCG/ML IJ SOSY
50.0000 ug | PREFILLED_SYRINGE | Freq: Once | INTRAMUSCULAR | Status: AC
  Administered 2020-09-23: 50 ug via INTRAVENOUS
  Filled 2020-09-23: qty 1

## 2020-09-23 MED ORDER — IOHEXOL 350 MG/ML SOLN
80.0000 mL | Freq: Once | INTRAVENOUS | Status: AC | PRN
  Administered 2020-09-23: 80 mL via INTRAVENOUS

## 2020-09-23 MED ORDER — CYCLOBENZAPRINE HCL 10 MG PO TABS: 10.0000 mg | ORAL_TABLET | Freq: Two times a day (BID) | ORAL | 0 refills | Status: AC | PRN

## 2020-09-23 NOTE — ED Notes (Signed)
Pt ambulatory without assistance.  

## 2020-09-23 NOTE — Discharge Instructions (Addendum)
Your imaging studies were negative for any traumatic injury other than some bruising across her abdomen.  You did have a nodule in your thyroid gland and its recommended that you get a thyroid ultrasound that can be arranged by your primary care provider.  You can alternate Tylenol and ibuprofen for symptomatic relief.  Return to emergency room if you have any worsening symptoms.
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
## 2020-09-23 NOTE — ED Notes (Signed)
C collar removed per Dr. Fredderick Phenix.  Pt given soft drink to see how well she tolerates PO. Will continue to monitor.
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
## 2020-10-07 DIAGNOSIS — M25562 Pain in left knee: Secondary | ICD-10-CM | POA: Diagnosis not present

## 2020-11-10 ENCOUNTER — Encounter: Payer: Self-pay | Admitting: Registered Nurse

## 2020-11-10 ENCOUNTER — Ambulatory Visit (INDEPENDENT_AMBULATORY_CARE_PROVIDER_SITE_OTHER): Payer: BC Managed Care – PPO | Admitting: Registered Nurse

## 2020-11-10 ENCOUNTER — Other Ambulatory Visit (HOSPITAL_COMMUNITY)
Admission: RE | Admit: 2020-11-10 | Discharge: 2020-11-10 | Disposition: A | Discharge status: Home / Self Care | Payer: BC Managed Care – PPO | Source: Ambulatory Visit | Attending: Registered Nurse | Admitting: Registered Nurse

## 2020-11-10 ENCOUNTER — Other Ambulatory Visit: Payer: Self-pay

## 2020-11-10 VITALS — BP 115/80 | HR 84 | Temp 97.7°F | Resp 18 | Ht 68.0 in | Wt 265.4 lb

## 2020-11-10 DIAGNOSIS — Z113 Encounter for screening for infections with a predominantly sexual mode of transmission: Secondary | ICD-10-CM | POA: Insufficient documentation

## 2020-11-10 DIAGNOSIS — Z130 Encounter for screening for diseases of the blood and blood-forming organs and certain disorders involving the immune mechanism: Secondary | ICD-10-CM

## 2020-11-10 DIAGNOSIS — Z13228 Encounter for screening for other metabolic disorders: Secondary | ICD-10-CM

## 2020-11-10 DIAGNOSIS — E041 Nontoxic single thyroid nodule: Secondary | ICD-10-CM | POA: Diagnosis not present

## 2020-11-10 DIAGNOSIS — Z13220 Encounter for screening for lipoid disorders: Secondary | ICD-10-CM | POA: Diagnosis not present

## 2020-11-10 DIAGNOSIS — Z1329 Encounter for screening for other suspected endocrine disorder: Secondary | ICD-10-CM

## 2020-11-10 LAB — COMPREHENSIVE METABOLIC PANEL
ALT: 29 U/L (ref 0–35)
AST: 17 U/L (ref 0–37)
Albumin: 4.1 g/dL (ref 3.5–5.2)
Alkaline Phosphatase: 59 U/L (ref 39–117)
BUN: 12 mg/dL (ref 6–23)
CO2: 23 mEq/L (ref 19–32)
Calcium: 8.4 mg/dL (ref 8.4–10.5)
Chloride: 106 mEq/L (ref 96–112)
Creatinine, Ser: 0.78 mg/dL (ref 0.40–1.20)
GFR: 101.97 mL/min (ref >60.00)
Glucose, Bld: 103 mg/dL — ABNORMAL HIGH (ref 70–99)
Potassium: 4 mEq/L (ref 3.5–5.1)
Sodium: 137 mEq/L (ref 135–145)
Total Bilirubin: 0.5 mg/dL (ref 0.2–1.2)
Total Protein: 6.8 g/dL (ref 6.0–8.3)

## 2020-11-10 LAB — CBC WITH DIFFERENTIAL/PLATELET
Basophils Absolute: 0 10*3/uL (ref 0.0–0.1)
Basophils Relative: 0.4 % (ref 0.0–3.0)
Eosinophils Absolute: 0.2 10*3/uL (ref 0.0–0.7)
Eosinophils Relative: 2 % (ref 0.0–5.0)
HCT: 41.5 % (ref 36.0–46.0)
Hemoglobin: 13.8 g/dL (ref 12.0–15.0)
Lymphocytes Relative: 21.4 % (ref 12.0–46.0)
Lymphs Abs: 1.9 10*3/uL (ref 0.7–4.0)
MCHC: 33.3 g/dL (ref 30.0–36.0)
MCV: 87.5 fl (ref 78.0–100.0)
Monocytes Absolute: 0.6 10*3/uL (ref 0.1–1.0)
Monocytes Relative: 6.9 % (ref 3.0–12.0)
Neutro Abs: 6.2 10*3/uL (ref 1.4–7.7)
Neutrophils Relative %: 69.3 % (ref 43.0–77.0)
Platelets: 244 10*3/uL (ref 150.0–400.0)
RBC: 4.74 Mil/uL (ref 3.87–5.11)
RDW: 14.6 % (ref 11.5–15.5)
WBC: 8.9 10*3/uL (ref 4.0–10.5)

## 2020-11-10 LAB — T4, FREE: Free T4: 0.8 ng/dL (ref 0.60–1.60)

## 2020-11-10 LAB — LIPID PANEL
Cholesterol: 150 mg/dL (ref 0–200)
HDL: 41.9 mg/dL (ref >39.00)
LDL Cholesterol: 83 mg/dL (ref 0–99)
NonHDL: 107.88
Total CHOL/HDL Ratio: 4
Triglycerides: 125 mg/dL (ref 0.0–149.0)
VLDL: 25 mg/dL (ref 0.0–40.0)

## 2020-11-10 LAB — HEMOGLOBIN A1C: Hgb A1c MFr Bld: 5.8 % (ref 4.6–6.5)

## 2020-11-10 LAB — TSH: TSH: 1.23 u[IU]/mL (ref 0.35–5.50)

## 2020-11-10 NOTE — Addendum Note (Signed)
Addended by: Janeece Agee on: 11/10/2020 01:30 PM   Modules accepted: Orders
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
## 2020-11-11 ENCOUNTER — Other Ambulatory Visit: Payer: Self-pay | Admitting: Registered Nurse

## 2020-11-11 DIAGNOSIS — B9689 Other specified bacterial agents as the cause of diseases classified elsewhere: Secondary | ICD-10-CM

## 2020-11-11 DIAGNOSIS — N760 Acute vaginitis: Secondary | ICD-10-CM

## 2020-11-11 LAB — HIV ANTIBODY (ROUTINE TESTING W REFLEX): HIV 1&2 Ab, 4th Generation: NONREACTIVE

## 2020-11-11 LAB — URINE CYTOLOGY ANCILLARY ONLY
Bacterial Vaginitis-Urine: POSITIVE — AB
Candida Urine: NEGATIVE
Chlamydia: NEGATIVE
Comment: NEGATIVE
Comment: NEGATIVE
Comment: NORMAL
Neisseria Gonorrhea: NEGATIVE
Trichomonas: NEGATIVE

## 2020-11-11 LAB — RPR: RPR Ser Ql: NONREACTIVE

## 2020-11-11 MED ORDER — METRONIDAZOLE 500 MG PO TABS: 500.0000 mg | ORAL_TABLET | Freq: Two times a day (BID) | ORAL | 0 refills | Status: AC

## 2020-11-25 ENCOUNTER — Ambulatory Visit
Admission: RE | Admit: 2020-11-25 | Discharge: 2020-11-25 | Disposition: A | Discharge status: Home / Self Care | Payer: BC Managed Care – PPO | Source: Ambulatory Visit | Attending: Registered Nurse | Admitting: Registered Nurse

## 2020-11-25 DIAGNOSIS — E041 Nontoxic single thyroid nodule: Secondary | ICD-10-CM

## 2020-11-29 ENCOUNTER — Other Ambulatory Visit: Payer: Self-pay | Admitting: Registered Nurse

## 2020-11-29 DIAGNOSIS — E041 Nontoxic single thyroid nodule: Secondary | ICD-10-CM

## 2020-12-14 ENCOUNTER — Encounter: Payer: Self-pay | Admitting: Registered Nurse

## 2021-01-21 ENCOUNTER — Other Ambulatory Visit: Payer: Self-pay | Admitting: Registered Nurse

## 2021-01-21 DIAGNOSIS — E041 Nontoxic single thyroid nodule: Secondary | ICD-10-CM

## 2021-01-26 ENCOUNTER — Other Ambulatory Visit: Payer: Self-pay | Admitting: Registered Nurse

## 2021-01-26 DIAGNOSIS — E041 Nontoxic single thyroid nodule: Secondary | ICD-10-CM

## 2021-02-16 ENCOUNTER — Ambulatory Visit (INDEPENDENT_AMBULATORY_CARE_PROVIDER_SITE_OTHER): Payer: BC Managed Care – PPO | Admitting: Endocrinology

## 2021-02-16 ENCOUNTER — Other Ambulatory Visit: Payer: Self-pay

## 2021-02-16 ENCOUNTER — Encounter: Payer: Self-pay | Admitting: Endocrinology

## 2021-02-16 DIAGNOSIS — E042 Nontoxic multinodular goiter: Secondary | ICD-10-CM

## 2021-02-16 NOTE — Progress Notes (Signed)
Subjective:    Patient ID: Gina Hernandez, female    DOB: 1990-06-28, 31 y.o.   MRN: [id number]  HPI Pt is referred by Maximiano Coss, NP, for nodular thyroid.  Pt was noted to have a thyroid nodule in 2022.  she is unaware of ever having had thyroid problems in the past.  she has no h/o XRT or surgery to the neck.   Past Medical History:  Diagnosis Date   Anxiety    History of chickenpox    PCOS (polycystic ovarian syndrome)     Past Surgical History:  Procedure Laterality Date   CHOLECYSTECTOMY N/A 06/27/2014   Procedure: LAPAROSCOPIC CHOLECYSTECTOMY ;  Surgeon: Georganna Skeans, MD;  Location: [facility] OR;  Service: General;  Laterality: N/A;   galbladder     WISDOM TOOTH EXTRACTION      Social History   Socioeconomic History   Marital status: Significant Other    Spouse name: Not on file   Number of children: 1   Years of education: Not on file   Highest education level: Not on file  Occupational History   Occupation: Government social research officer  Tobacco Use   Smoking status: Never   Smokeless tobacco: Never  Vaping Use   Vaping Use: Never used  Substance and Sexual Activity   Alcohol use: Yes    Comment: occ   Drug use: No   Sexual activity: Yes    Partners: Male    Birth control/protection: Condom  Other Topics Concern   Not on file  Social History Narrative   Not on file   Social Determinants of Health   Financial Resource Strain: Not on file  Food Insecurity: Not on file  Transportation Needs: Not on file  Physical Activity: Not on file  Stress: Not on file  Social Connections: Not on file  Intimate Partner Violence: Not on file    Current Outpatient Medications on File Prior to Visit  Medication Sig Dispense Refill   cetirizine (ZYRTEC) 10 MG tablet Take 10 mg by mouth daily as needed for allergies.     cyclobenzaprine (FLEXERIL) 10 MG tablet Take 1 tablet (10 mg total) by mouth 2 (two) times daily as needed for muscle spasms. 20 tablet 0   LO LOESTRIN FE 1 MG-10  MCG / 10 MCG tablet TAKE 1 TABLET BY MOUTH EVERY DAY 84 tablet 4   phentermine 15 MG capsule Take 1 capsule (15 mg total) by mouth every morning. 30 capsule 0   No current facility-administered medications on file prior to visit.    No Known Allergies  Family History  Problem Relation Age of Onset   Cancer Neg Hx    Birth defects Neg Hx    Diabetes Neg Hx    Hypertension Neg Hx    Stroke Neg Hx    Thyroid disease Neg Hx     BP 110/78    Pulse 64    Ht 5\' 8"  (1.727 m)    Wt 256 lb 6.4 oz (116.3 kg)    SpO2 98%    BMI 38.99 kg/m    Review of Systems Denies hoarseness, neck pain, and sob.      Objective:   Physical Exam VITAL SIGNS:  See vs page GENERAL: no distress NECK:  left 2 cm thyroid nodule is palpable, but not the right nodule.    Lab Results  Component Value Date   TSH 1.23 11/10/2020    USSolitary bilateral thyroid nodules (the left side of which correlates  with the nodule seen on preceding chest CT), both of which meet imaging criteria to recommend bx  I have reviewed outside records, and summarized: Pt was noted to have MNG, and referred here.  She had CT for an unrelated reason, and goiter was incidentally noted    Assessment & Plan:  MNG, new to me, uncertain etiology and prognosis.  No medication is needed   Patient Instructions  Let's check the biopsy, guided by the ultrasound.  you will receive a phone call, about a day and time for an appointment.   If no cancer is found, please come back for a follow-up appointment in 8 months.

## 2021-02-16 NOTE — Patient Instructions (Addendum)
Let's check the biopsy, guided by the ultrasound.  you will receive a phone call, about a day and time for an appointment. If no cancer is found, please come back for a follow-up appointment in 8 months.    

## 2021-02-21 ENCOUNTER — Other Ambulatory Visit: Payer: Self-pay | Admitting: Endocrinology

## 2021-02-21 DIAGNOSIS — E042 Nontoxic multinodular goiter: Secondary | ICD-10-CM

## 2021-03-22 ENCOUNTER — Ambulatory Visit
Admission: RE | Admit: 2021-03-22 | Discharge: 2021-03-22 | Disposition: A | Discharge status: Home / Self Care | Payer: BC Managed Care – PPO | Source: Ambulatory Visit | Attending: Endocrinology | Admitting: Endocrinology

## 2021-03-22 ENCOUNTER — Other Ambulatory Visit (HOSPITAL_COMMUNITY)
Admission: RE | Admit: 2021-03-22 | Discharge: 2021-03-22 | Disposition: A | Discharge status: Home / Self Care | Payer: BC Managed Care – PPO | Source: Ambulatory Visit | Attending: Student | Admitting: Student

## 2021-03-22 DIAGNOSIS — E042 Nontoxic multinodular goiter: Secondary | ICD-10-CM | POA: Insufficient documentation

## 2021-03-22 DIAGNOSIS — E041 Nontoxic single thyroid nodule: Secondary | ICD-10-CM | POA: Diagnosis not present

## 2021-03-23 LAB — CYTOLOGY - NON PAP

## 2021-05-03 ENCOUNTER — Encounter: Payer: Self-pay | Admitting: Registered Nurse

## 2021-07-18 DIAGNOSIS — O99341 Other mental disorders complicating pregnancy, first trimester: Secondary | ICD-10-CM | POA: Diagnosis not present

## 2021-07-18 DIAGNOSIS — O99281 Endocrine, nutritional and metabolic diseases complicating pregnancy, first trimester: Secondary | ICD-10-CM | POA: Diagnosis not present

## 2021-07-18 DIAGNOSIS — E6609 Other obesity due to excess calories: Secondary | ICD-10-CM | POA: Diagnosis not present

## 2021-07-18 DIAGNOSIS — F329 Major depressive disorder, single episode, unspecified: Secondary | ICD-10-CM | POA: Diagnosis not present

## 2021-07-18 DIAGNOSIS — Z6838 Body mass index (BMI) 38.0-38.9, adult: Secondary | ICD-10-CM | POA: Diagnosis not present

## 2021-07-18 DIAGNOSIS — E042 Nontoxic multinodular goiter: Secondary | ICD-10-CM | POA: Diagnosis not present

## 2021-07-18 DIAGNOSIS — Z3A01 Less than 8 weeks gestation of pregnancy: Secondary | ICD-10-CM | POA: Diagnosis not present
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
## 2022-02-01 IMAGING — US US THYROID
1 series · 13 of 25 positions shown · non-contrast
Comparison: Chest CT-09/23/2020

CLINICAL DATA: Incidental on CT. Left-sided thyroid nodule
incidentally noted on chest CT

EXAM:
THYROID ULTRASOUND
TECHNIQUE: Ultrasound examination of the thyroid gland and adjacent soft
tissues was performed.

[Series 1: us thyroid · 0.06mm/px · 13 of 47 slices shown]
[im 1/47]
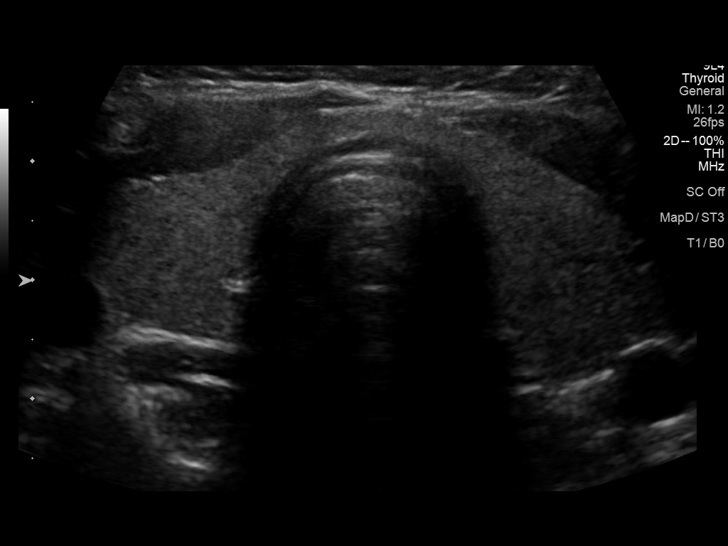
[im 4/47]
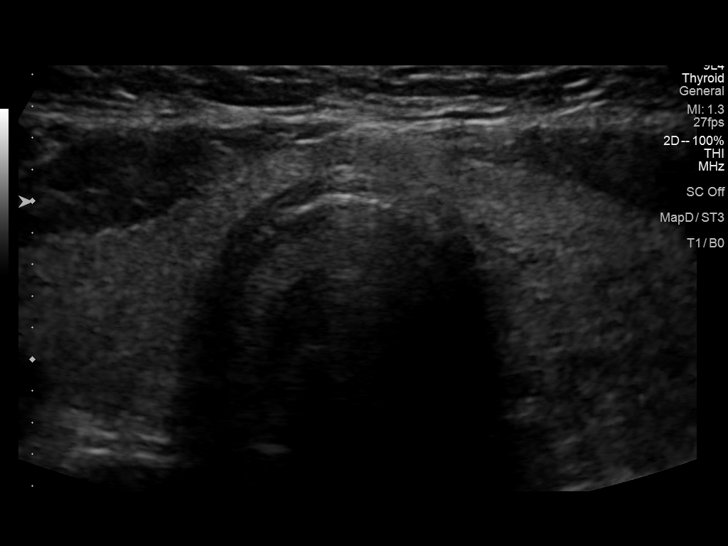
[im 8/47]
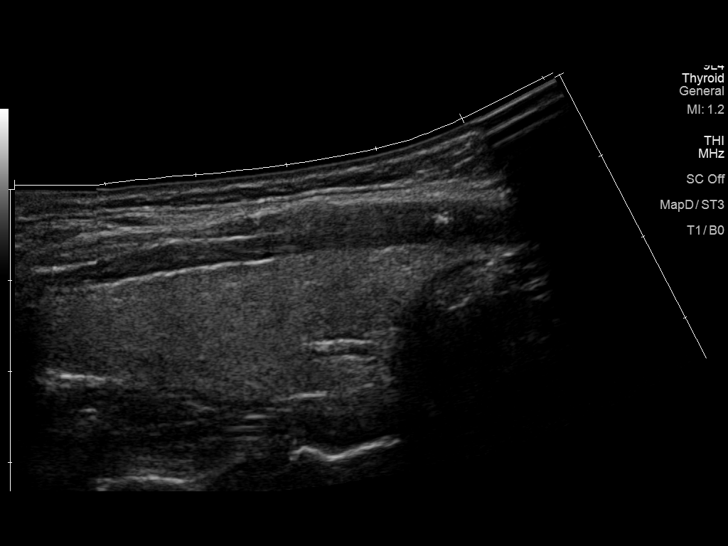
[im 12/47]
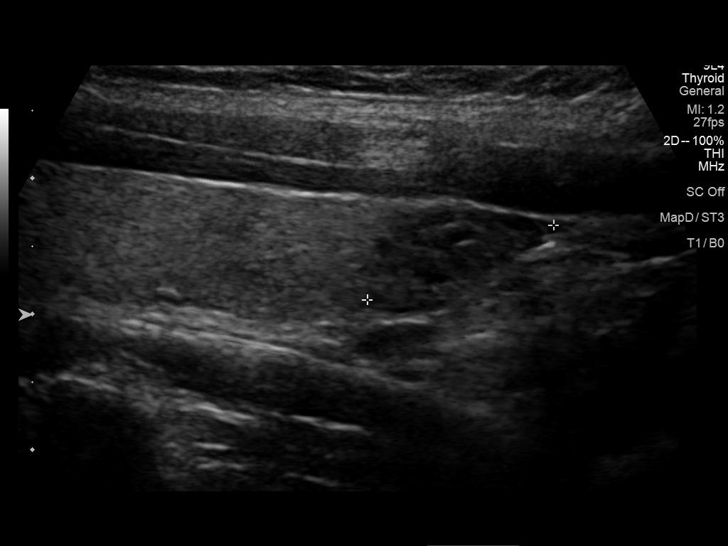
[im 16/47]
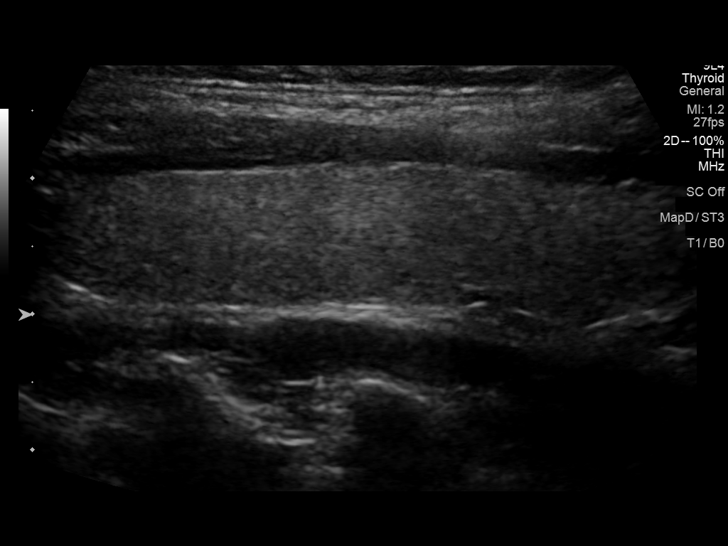
[im 20/47]
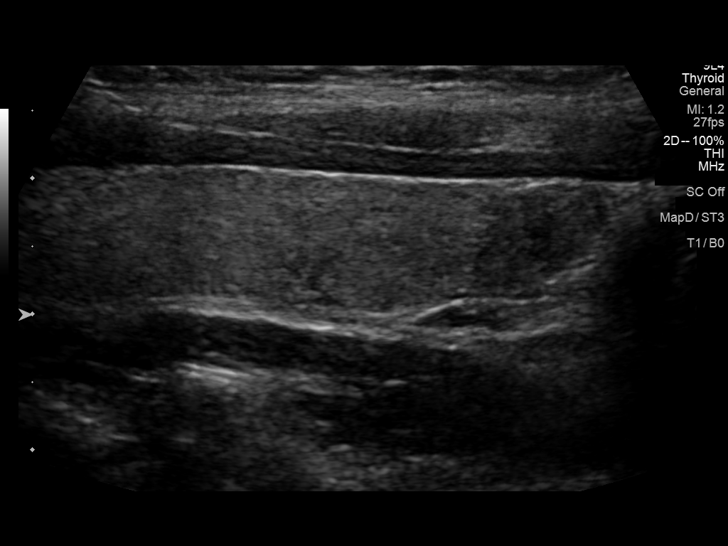
[im 24/47]
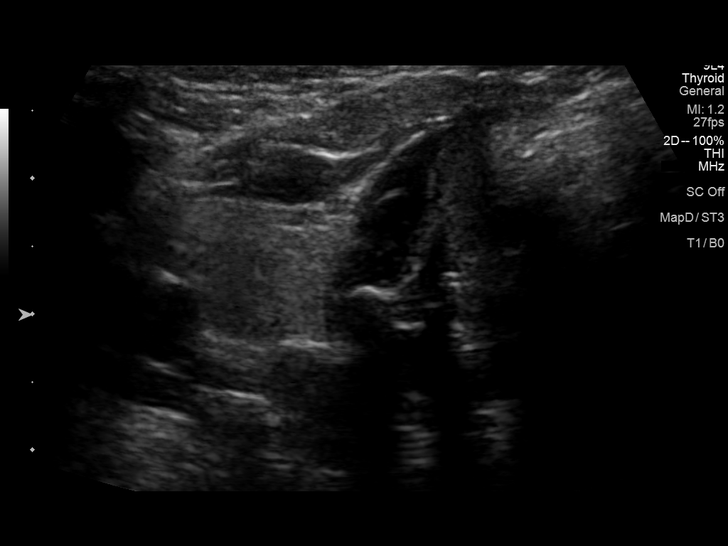
[im 27/47]
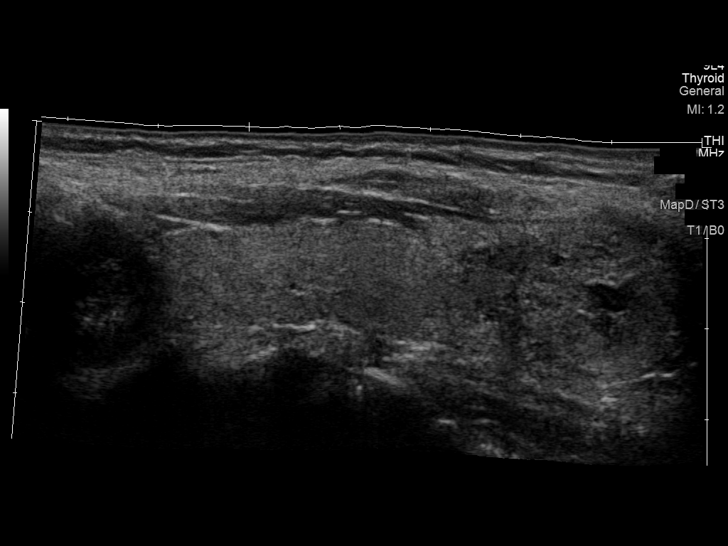
[im 31/47]
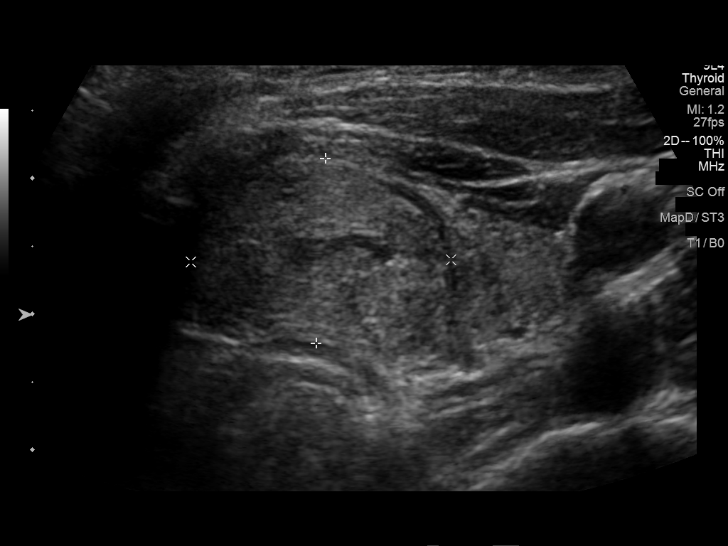
[im 35/47]
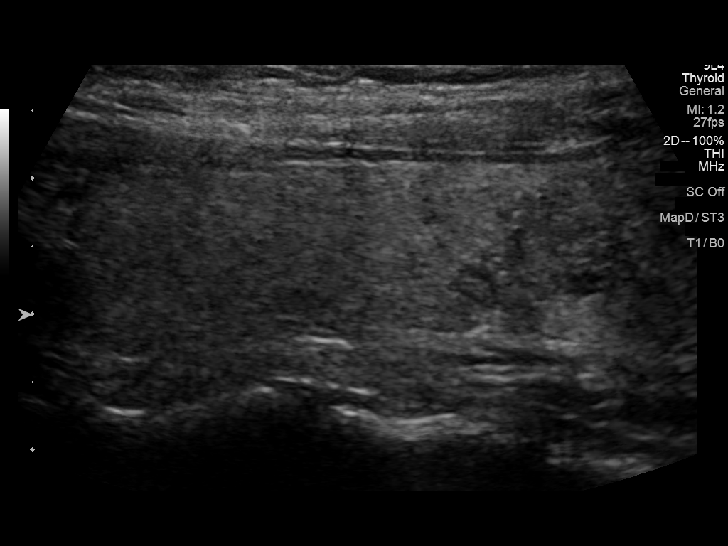
[im 39/47]
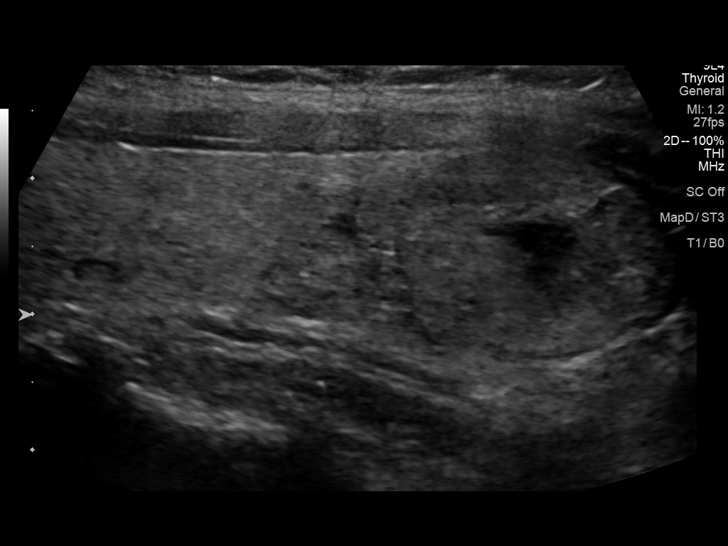
[im 43/47]
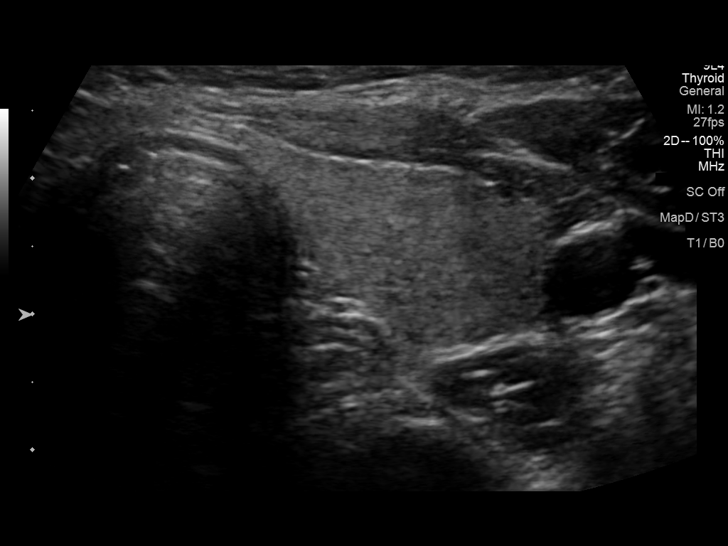
[im 47/47]
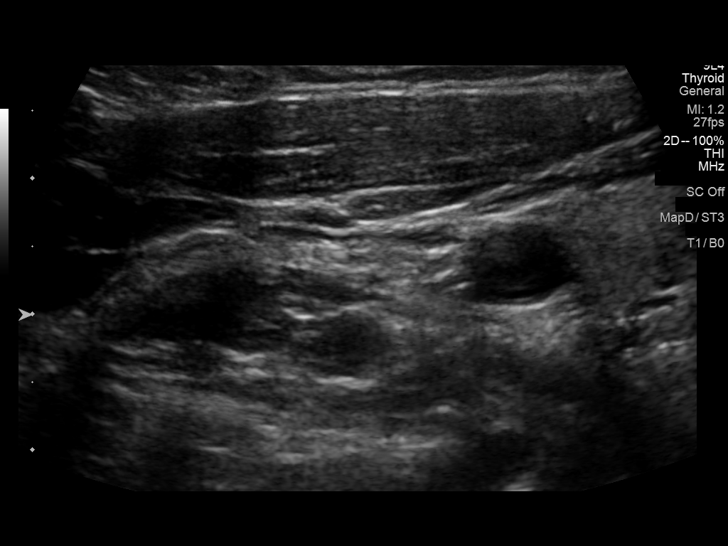

[13 of 25 positions shown; findings below may reference images not displayed]

FINDINGS: Parenchymal Echotexture: Normal

Isthmus: Normal in size measures 0.4 cm in diameter

Right lobe: Normal in size measuring 5.3 x 1.6 x 2.2 cm

Left lobe: Normal in size measuring 5.8 x 1.8 x 1.9 cm

_________________________________________________________

Estimated total number of nodules >/= 1 cm: 2

Number of spongiform nodules >/=  2 cm not described below (TR1): 0

Number of mixed cystic and solid nodules >/= 1.5 cm not described
below (TR2): 0

_________________________________________________________

Nodule # 1:

Location: Right; Inferior

Maximum size: 1.5 cm; Other 2 dimensions: 0.9 x 0.8 cm

Composition: solid/almost completely solid (2)

Echogenicity: hypoechoic (2)

Shape: not taller-than-wide (0)

Margins: smooth (0)

Echogenic foci: none (0)

ACR TI-RADS total points: 4.

ACR TI-RADS risk category: TR4 (4-6 points).

ACR TI-RADS recommendations:

**Given size (>/= 1.5 cm) and appearance, fine needle aspiration of
this moderately suspicious nodule should be considered based on
TI-RADS criteria.

_________________________________________________________

Nodule # 2:

Location: Left; Inferior - correlates with the nodule seen on
preceding chest CT

Maximum size: 2.5 cm; Other 2 dimensions: 1.9 x 1.4 cm

Composition: solid/almost completely solid (2)

Echogenicity: isoechoic (1)

Shape: not taller-than-wide (0)

Margins: ill-defined (0)

Echogenic foci: none (0)

ACR TI-RADS total points: 3.

ACR TI-RADS risk category: TR3 (3 points).

ACR TI-RADS recommendations:

**Given size (>/= 2.5 cm) and appearance, fine needle aspiration of
this mildly suspicious nodule should be considered based on TI-RADS
criteria.

_________________________________________________________
IMPRESSION: Solitary bilateral thyroid nodules (the left side of which
correlates with the nodule seen on preceding chest CT), both of
which meet imaging criteria to recommend percutaneous sampling as
indicated.

The above is in keeping with the ACR TI-RADS recommendations - [HOSPITAL] 4556;[DATE].

## 2022-05-29 IMAGING — US US FNA BIOPSY THYROID 1ST LESION
1 series · 13 of 20 positions shown · non-contrast
Comparison: Ultrasound thyroid 11/25/2020

MEDICATIONS:
1% lidocaine 8 mL

COMPLICATIONS:
None immediate.

INDICATION: Indeterminate right inferior and left inferior thyroid nodules

EXAM:
ULTRASOUND GUIDED FINE NEEDLE ASPIRATION OF INDETERMINATE THYROID
NODULES
TECHNIQUE: Informed written consent was obtained from the patient after a
discussion of the risks, benefits and alternatives to treatment.
Questions regarding the procedure were encouraged and answered. A
timeout was performed prior to the initiation of the procedure.

[Series 1: us fna biopsy thyroid 1st lesion · 0.04mm/px · 20 acquisitions, 13 frames shown]
[im 1/20]
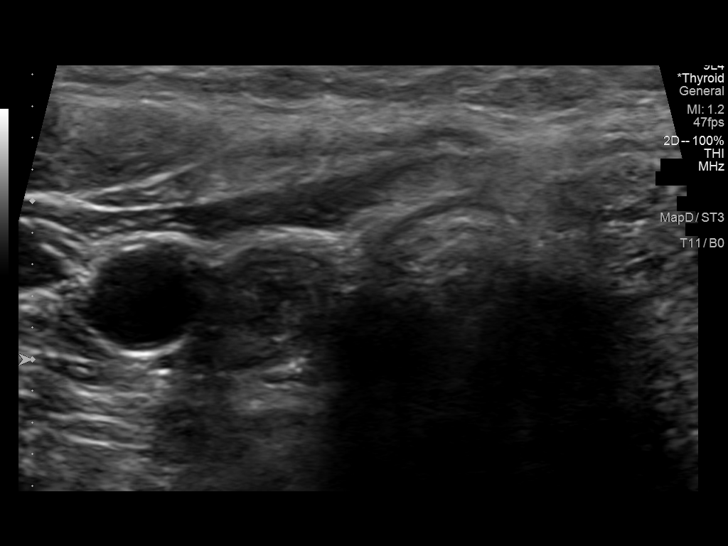
[im 3/20]
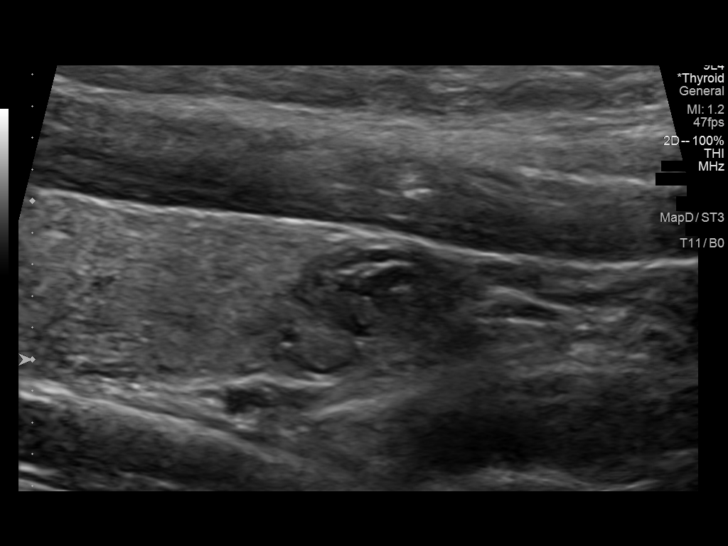
[im 4/20]
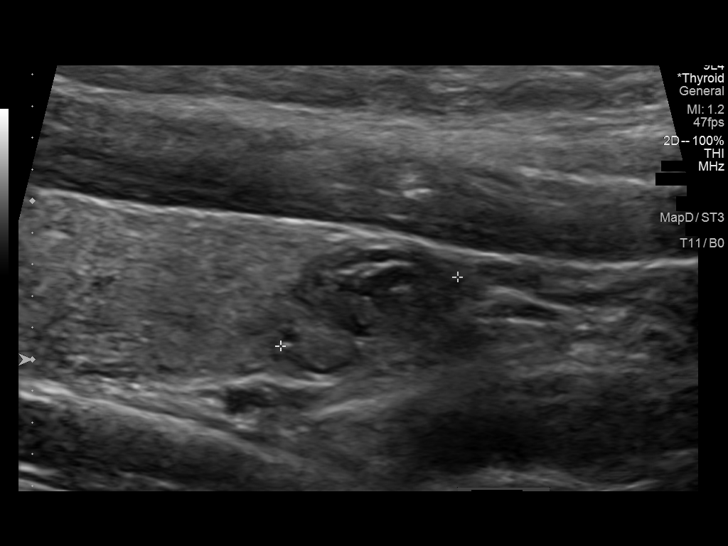
[im 6/20]
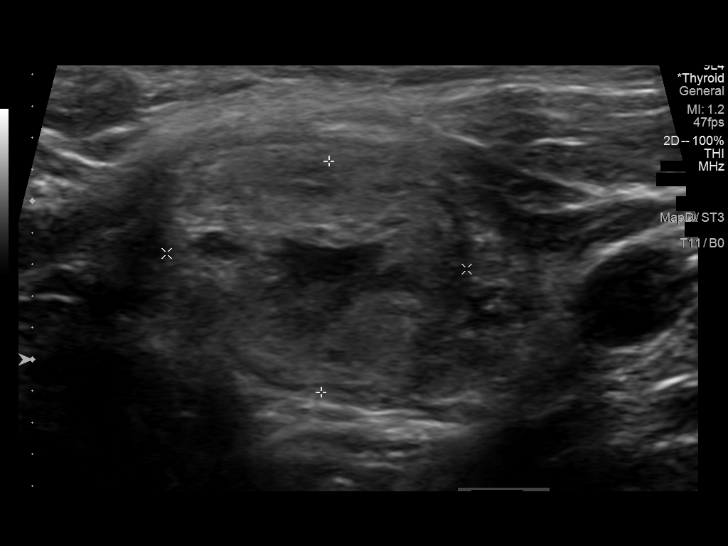
[im 7/20]
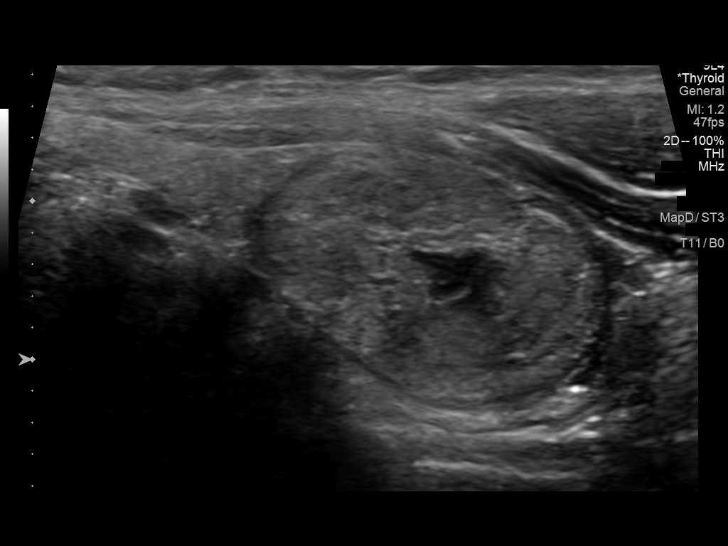
[im 9/20]
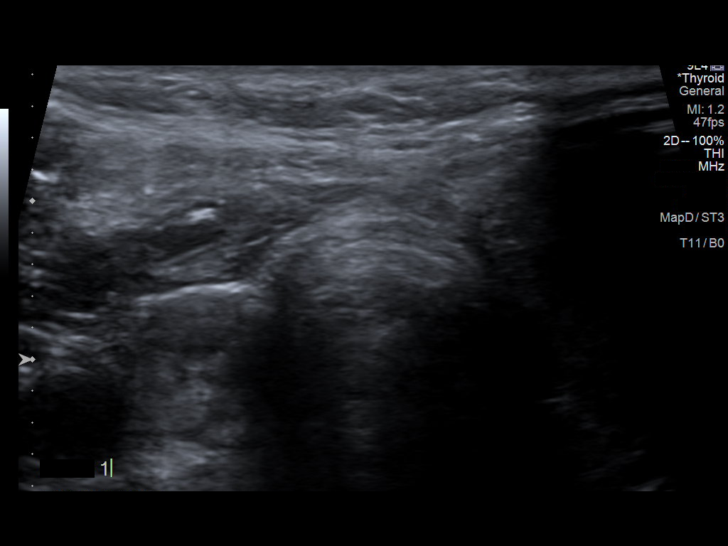
[im 11/20]
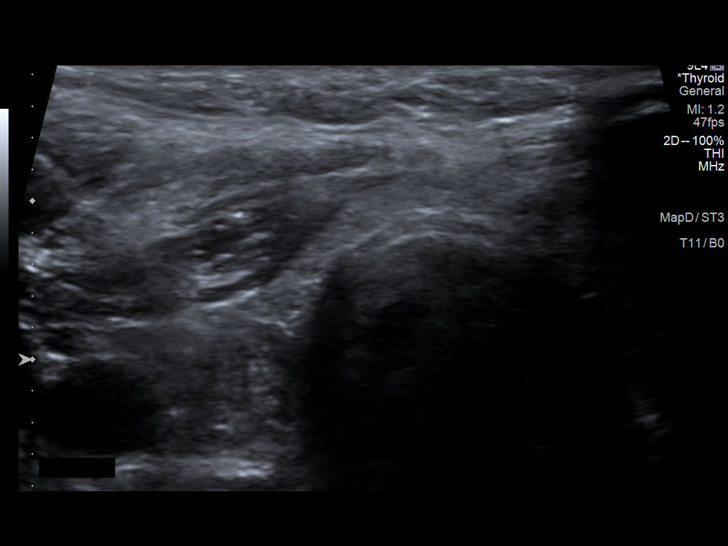
[im 12/20]
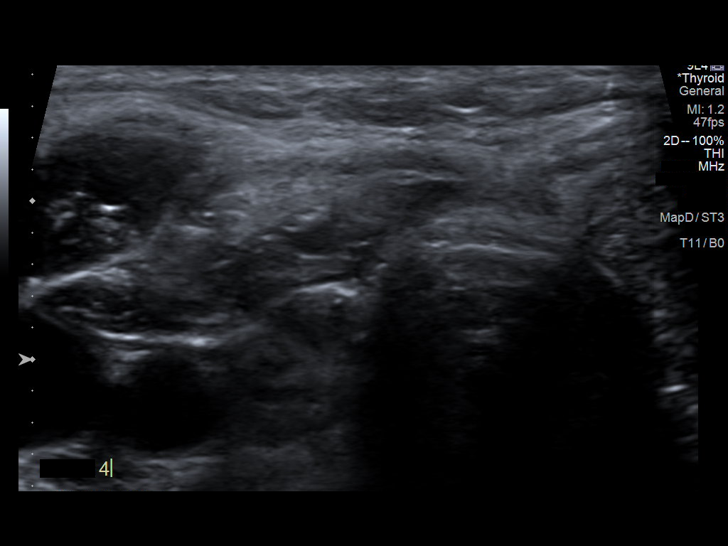
[im 14/20]
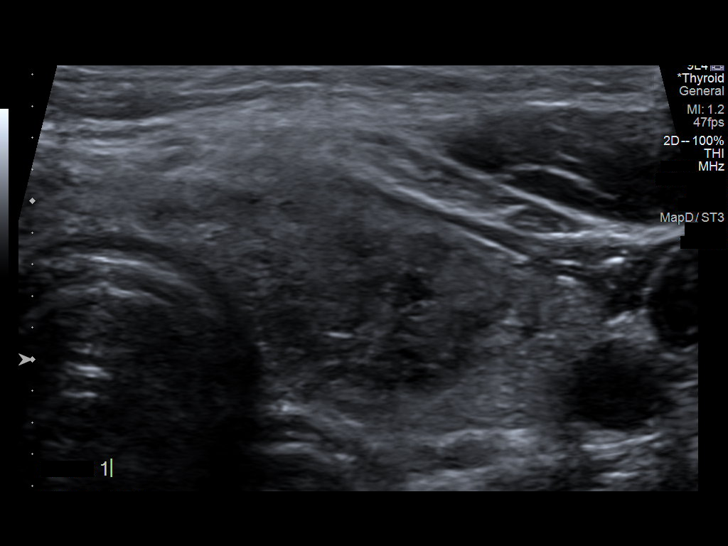
[im 15/20]
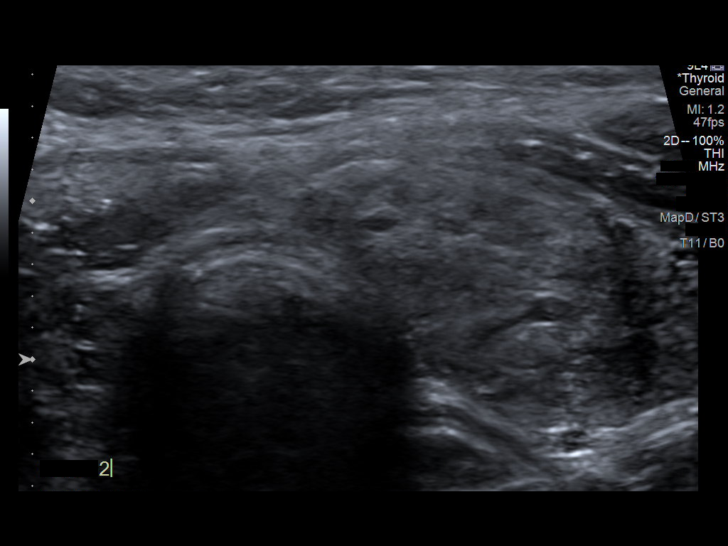
[im 17/20]
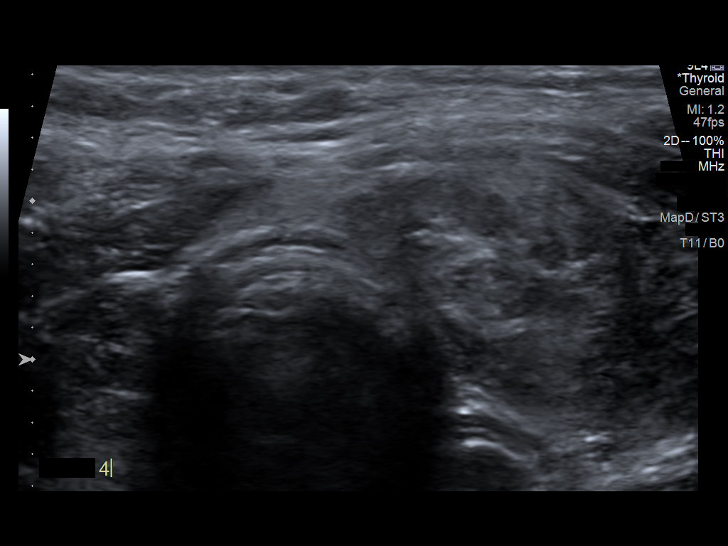
[im 18/20]
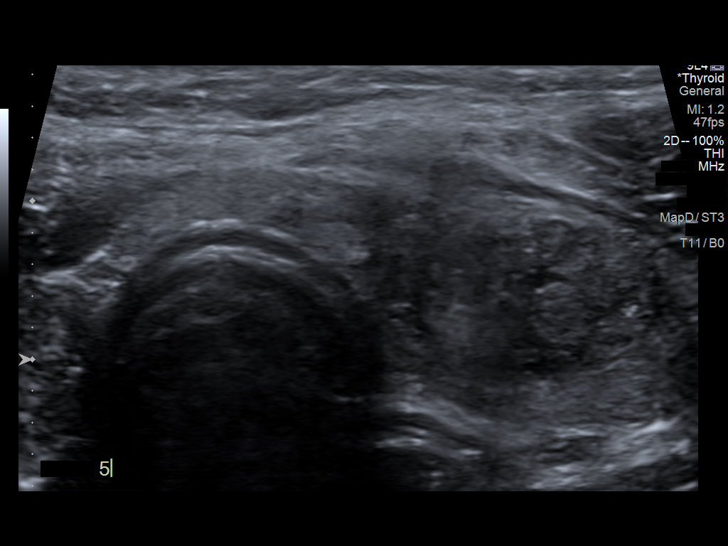
[im 20/20]
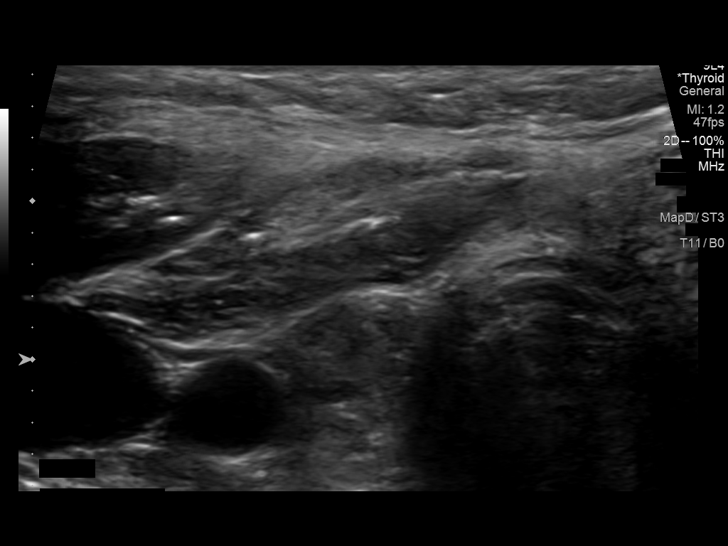

[13 of 20 positions shown; findings below may reference images not displayed]

Pre-procedural ultrasound scanning demonstrated unchanged size and
appearance of the indeterminate nodules within the right inferior
and left inferior thyroid.

The procedure was planned. The neck was prepped in the usual sterile
fashion, and a sterile drape was applied covering the operative
field. A timeout was performed prior to the initiation of the
procedure. Local anesthesia was provided with 1% lidocaine.

Under direct ultrasound guidance, 5 FNA biopsies were performed of
the right inferior and 5 FNA biopsies were performed of the left
inferior with 27 gauge needles. Multiple ultrasound images were
saved for procedural documentation purposes. The samples were
prepared and submitted to pathology. Two samples from each nodule
were prepared for Afirma testing.

Limited post procedural scanning was negative for hematoma or
additional complication. Dressings were placed. The patient
tolerated the above procedures procedure well without immediate
postprocedural complication.
FINDINGS: FINDINGS
Nodule reference number based on prior diagnostic ultrasound: 1

Maximum size: 1.5 cm

Location: Right  ;  Inferior

ACR TI-RADS risk category:  TR4

Reason for biopsy: meets ACR TI-RADS criteria

Nodule reference number based on prior diagnostic ultrasound: 2

Maximum size: 2.5 cm

Location: Left ;  Inferior

ACR TI-RADS risk category:  TR3

Reason for biopsy: meets ACR TI-RADS criteria

Ultrasound imaging confirms appropriate placement of the needles
within the thyroid nodules.
IMPRESSION: Technically successful ultrasound guided fine needle aspiration of
left inferior and right inferior thyroid nodules. Read by: Tobon
Caminda, NP

## 2022-05-29 IMAGING — US US FNA BIOPSY THYROID 1ST LESION
1 series · 13 of 20 positions shown · non-contrast
Comparison: Ultrasound thyroid 11/25/2020

MEDICATIONS:
1% lidocaine 8 mL

COMPLICATIONS:
None immediate.

INDICATION: Indeterminate right inferior and left inferior thyroid nodules

EXAM:
ULTRASOUND GUIDED FINE NEEDLE ASPIRATION OF INDETERMINATE THYROID
NODULES
TECHNIQUE: Informed written consent was obtained from the patient after a
discussion of the risks, benefits and alternatives to treatment.
Questions regarding the procedure were encouraged and answered. A
timeout was performed prior to the initiation of the procedure.

[Series 1: us fna biopsy thyroid 1st lesion · 0.04mm/px · 20 acquisitions, 13 frames shown]
[im 1/20]
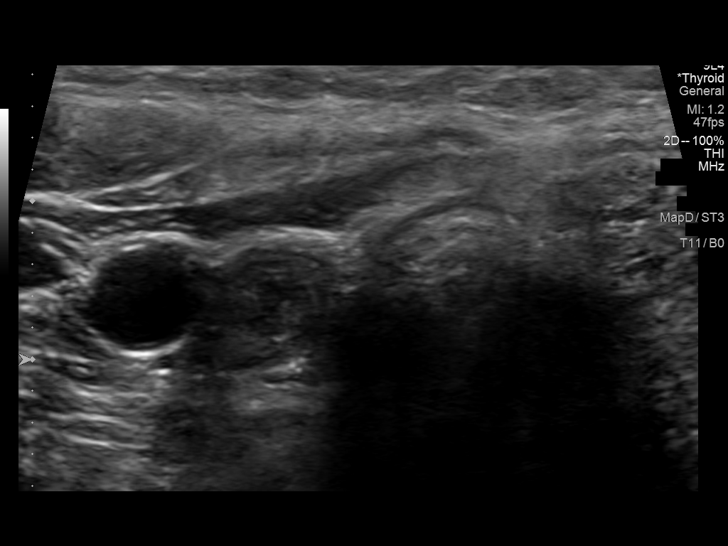
[im 3/20]
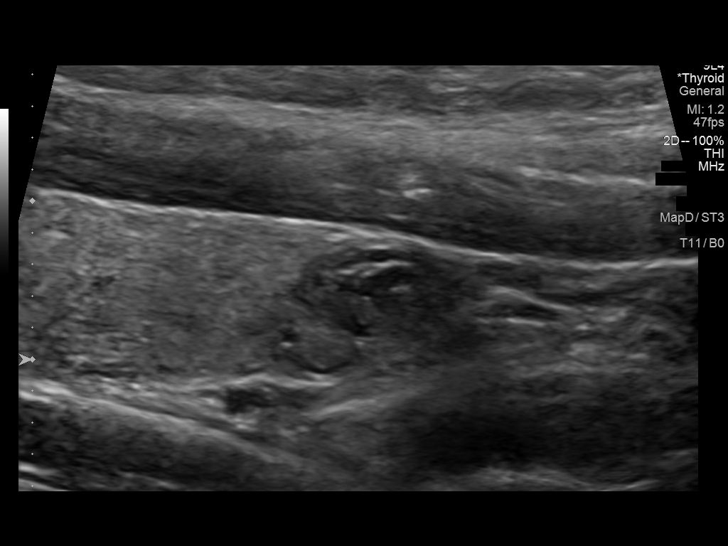
[im 4/20]
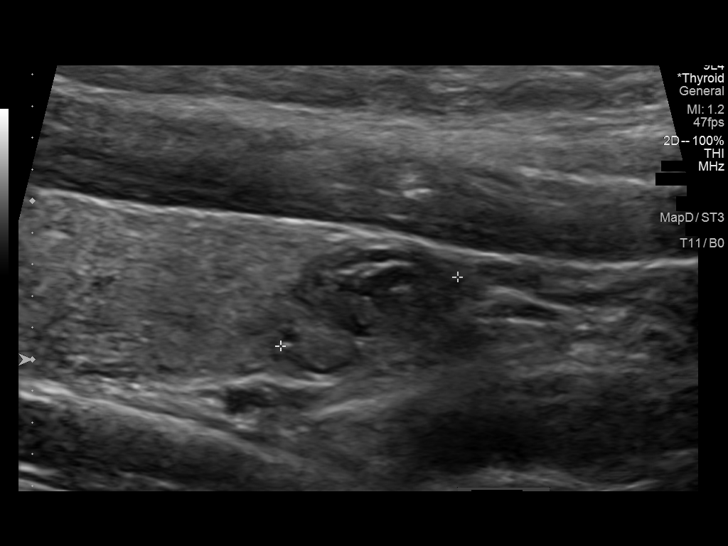
[im 6/20]
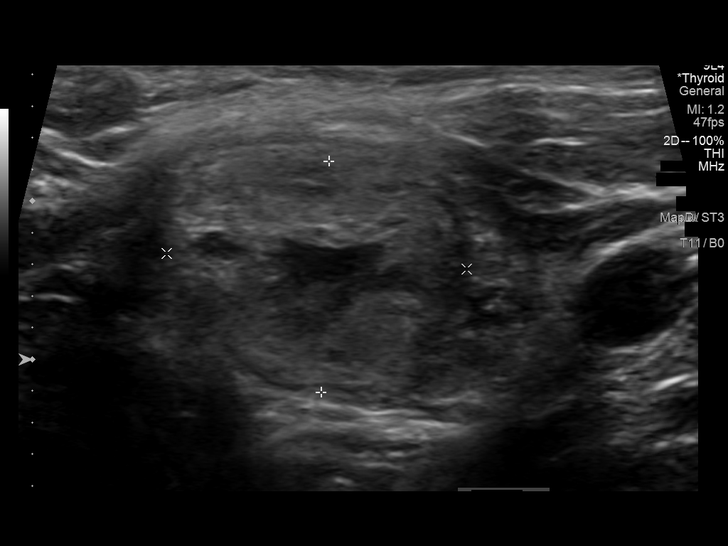
[im 7/20]
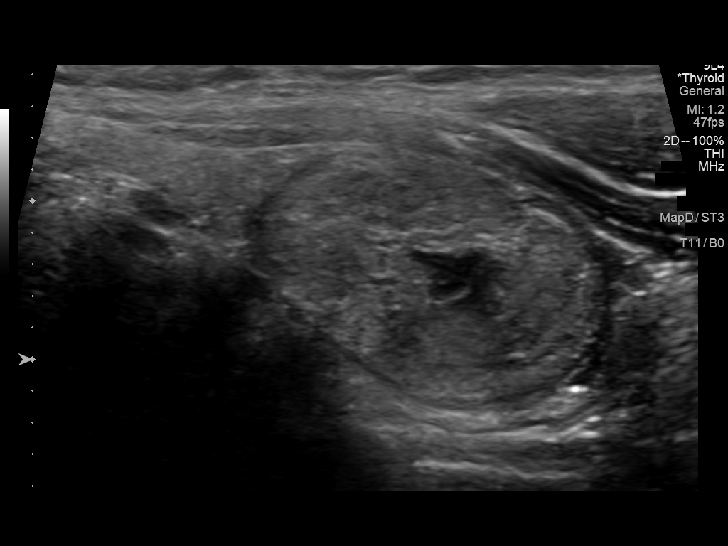
[im 9/20]
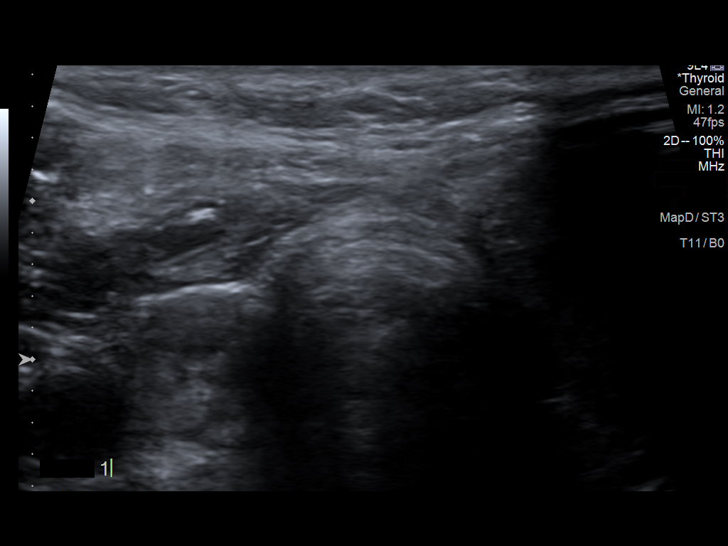
[im 11/20]
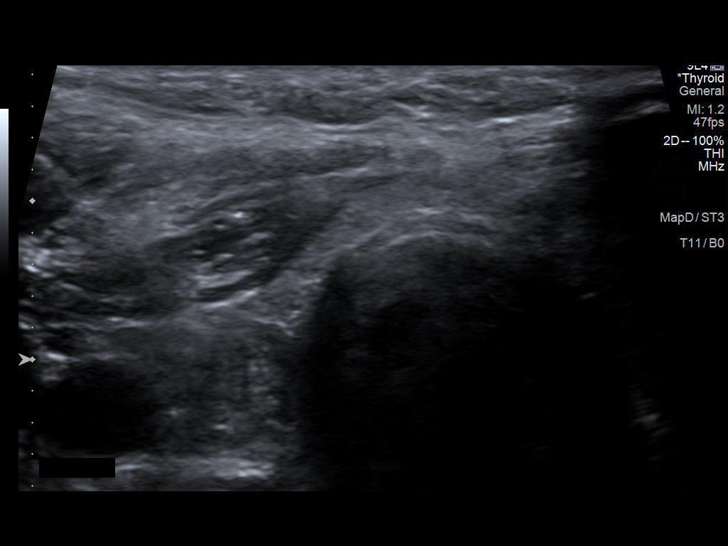
[im 12/20]
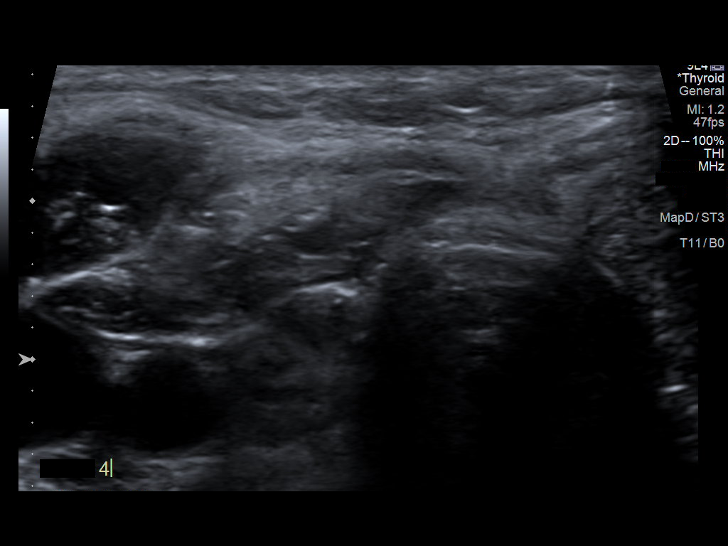
[im 14/20]
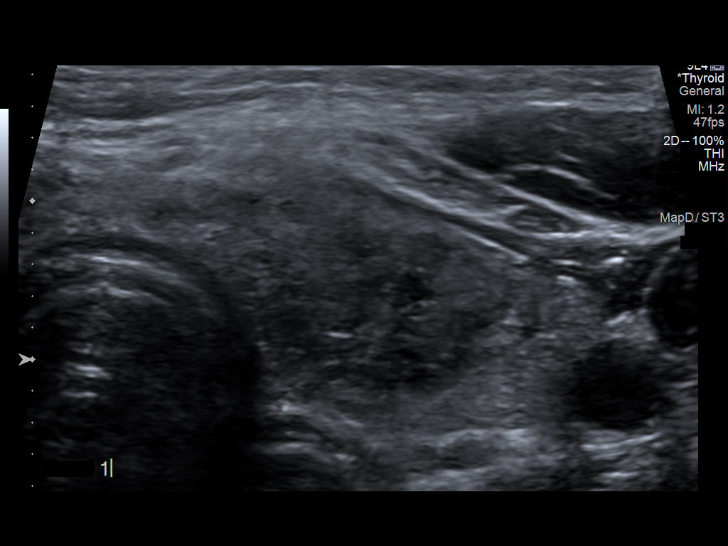
[im 15/20]
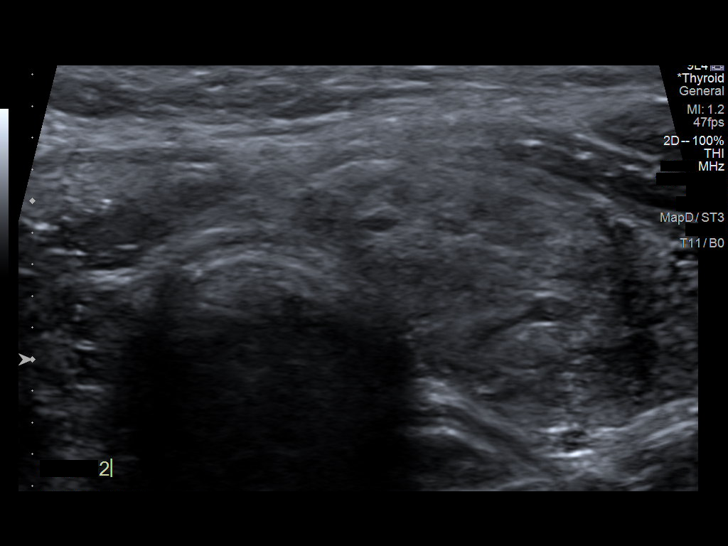
[im 17/20]
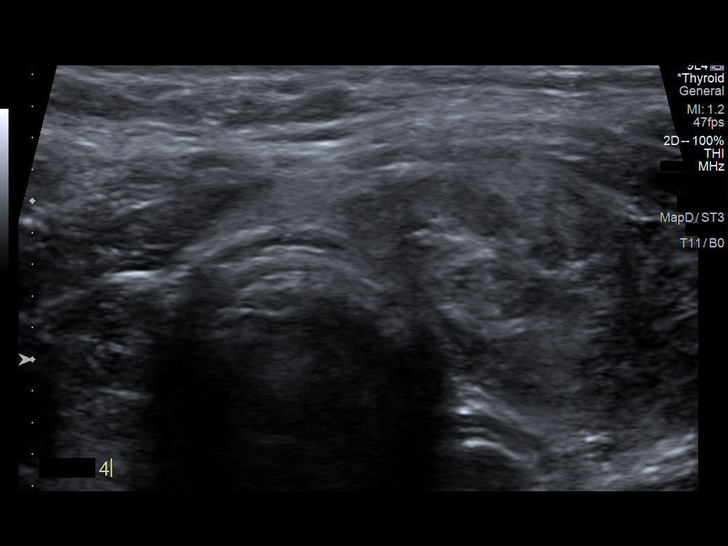
[im 18/20]
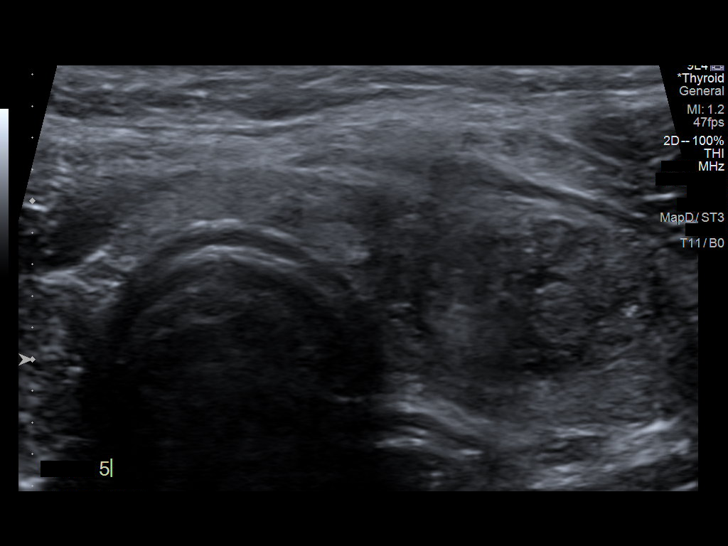
[im 20/20]
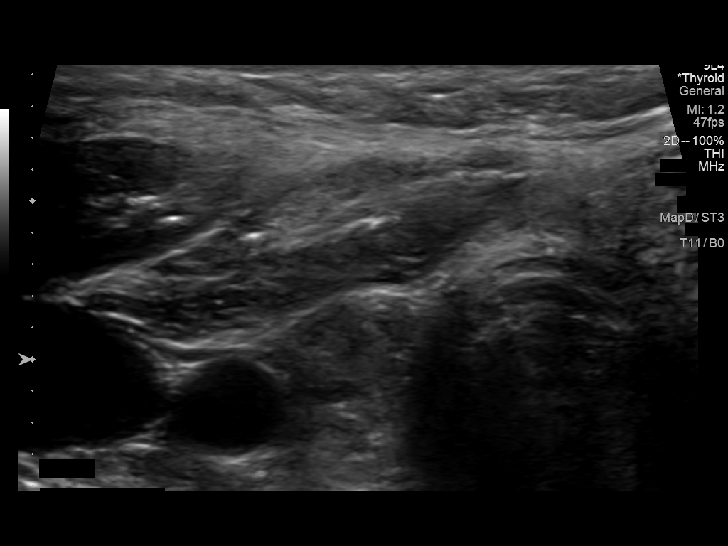

[13 of 20 positions shown; findings below may reference images not displayed]

Pre-procedural ultrasound scanning demonstrated unchanged size and
appearance of the indeterminate nodules within the right inferior
and left inferior thyroid.

The procedure was planned. The neck was prepped in the usual sterile
fashion, and a sterile drape was applied covering the operative
field. A timeout was performed prior to the initiation of the
procedure. Local anesthesia was provided with 1% lidocaine.

Under direct ultrasound guidance, 5 FNA biopsies were performed of
the right inferior and 5 FNA biopsies were performed of the left
inferior with 27 gauge needles. Multiple ultrasound images were
saved for procedural documentation purposes. The samples were
prepared and submitted to pathology. Two samples from each nodule
were prepared for Afirma testing.

Limited post procedural scanning was negative for hematoma or
additional complication. Dressings were placed. The patient
tolerated the above procedures procedure well without immediate
postprocedural complication.
FINDINGS: FINDINGS
Nodule reference number based on prior diagnostic ultrasound: 1

Maximum size: 1.5 cm

Location: Right  ;  Inferior

ACR TI-RADS risk category:  TR4

Reason for biopsy: meets ACR TI-RADS criteria

Nodule reference number based on prior diagnostic ultrasound: 2

Maximum size: 2.5 cm

Location: Left ;  Inferior

ACR TI-RADS risk category:  TR3

Reason for biopsy: meets ACR TI-RADS criteria

Ultrasound imaging confirms appropriate placement of the needles
within the thyroid nodules.
IMPRESSION: Technically successful ultrasound guided fine needle aspiration of
left inferior and right inferior thyroid nodules. Read by: Tobon
Caminda, NP

## 2023-02-04 ENCOUNTER — Emergency Department (HOSPITAL_BASED_OUTPATIENT_CLINIC_OR_DEPARTMENT_OTHER)
Admission: EM | Admit: 2023-02-04 | Discharge: 2023-02-04 | Disposition: A | Discharge status: Home / Self Care | Payer: BC Managed Care – PPO | Attending: Emergency Medicine | Admitting: Emergency Medicine

## 2023-02-04 ENCOUNTER — Other Ambulatory Visit: Payer: Self-pay

## 2023-02-04 ENCOUNTER — Emergency Department (HOSPITAL_BASED_OUTPATIENT_CLINIC_OR_DEPARTMENT_OTHER): Payer: BC Managed Care – PPO

## 2023-02-04 ENCOUNTER — Encounter (HOSPITAL_BASED_OUTPATIENT_CLINIC_OR_DEPARTMENT_OTHER): Payer: Self-pay | Admitting: Emergency Medicine

## 2023-02-04 DIAGNOSIS — F419 Anxiety disorder, unspecified: Secondary | ICD-10-CM | POA: Diagnosis not present

## 2023-02-04 DIAGNOSIS — R0789 Other chest pain: Secondary | ICD-10-CM | POA: Insufficient documentation

## 2023-02-04 LAB — CBC WITH DIFFERENTIAL/PLATELET
Abs Immature Granulocytes: 0.05 10*3/uL (ref 0.00–0.07)
Basophils Absolute: 0 10*3/uL (ref 0.0–0.1)
Basophils Relative: 0 %
Eosinophils Absolute: 0.1 10*3/uL (ref 0.0–0.5)
Eosinophils Relative: 1 %
HCT: 42.9 % (ref 36.0–46.0)
Hemoglobin: 14.2 g/dL (ref 12.0–15.0)
Immature Granulocytes: 0 %
Lymphocytes Relative: 11 %
Lymphs Abs: 1.4 10*3/uL (ref 0.7–4.0)
MCH: 29.2 pg (ref 26.0–34.0)
MCHC: 33.1 g/dL (ref 30.0–36.0)
MCV: 88.1 fL (ref 80.0–100.0)
Monocytes Absolute: 0.5 10*3/uL (ref 0.1–1.0)
Monocytes Relative: 4 %
Neutro Abs: 11.3 10*3/uL — ABNORMAL HIGH (ref 1.7–7.7)
Neutrophils Relative %: 84 %
Platelets: 296 10*3/uL (ref 150–400)
RBC: 4.87 MIL/uL (ref 3.87–5.11)
RDW: 13.2 % (ref 11.5–15.5)
WBC: 13.4 10*3/uL — ABNORMAL HIGH (ref 4.0–10.5)
nRBC: 0 % (ref 0.0–0.2)

## 2023-02-04 LAB — BASIC METABOLIC PANEL
Anion gap: 8 (ref 5–15)
BUN: 10 mg/dL (ref 6–20)
CO2: 23 mmol/L (ref 22–32)
Calcium: 8.9 mg/dL (ref 8.9–10.3)
Chloride: 104 mmol/L (ref 98–111)
Creatinine, Ser: 0.79 mg/dL (ref 0.44–1.00)
GFR, Estimated: >60 mL/min (ref >60)
Glucose, Bld: 118 mg/dL — ABNORMAL HIGH (ref 70–99)
Potassium: 3.7 mmol/L (ref 3.5–5.1)
Sodium: 135 mmol/L (ref 135–145)

## 2023-02-04 LAB — TROPONIN I (HIGH SENSITIVITY): Troponin I (High Sensitivity): <2 ng/L (ref <18)

## 2023-02-04 MED ORDER — ACETAMINOPHEN 500 MG PO TABS: 1000.0000 mg | ORAL_TABLET | Freq: Once | ORAL | Status: DC

## 2023-02-04 MED ORDER — ALUM & MAG HYDROXIDE-SIMETH 200-200-20 MG/5ML PO SUSP: 30.0000 mL | Freq: Once | ORAL | Status: DC

## 2023-02-04 MED ORDER — LIDOCAINE VISCOUS HCL 2 % MT SOLN: 15.0000 mL | Freq: Once | OROMUCOSAL | Status: DC

## 2023-02-04 NOTE — ED Triage Notes (Signed)
Pt c/o middle upper chest pain, described as cramping x 1 mo; denies other sxs; pt tearful in triage and also reports anxiety

## 2023-02-04 NOTE — ED Provider Notes (Signed)
[place] [facility] Provider Note   CSN: [id number] Arrival date & time: 02/04/23  1600     History  Chief Complaint  Patient presents with   Chest Pain   Anxiety    Gina Hernandez is a 33 y.o. female with no significant past medical history presents with concern for intermittent chest pains that have been occurring over the past month.  States the pain occurs in different parts of her chest, and the pain lasts a couple seconds to minutes at a time, then resolves.  Pain is nonexertional, nonpleuritic.  Pain does not radiate to the back.  Denies any feelings of regurgitation or relation to food intake.  Patient feels this may be related to anxiety.  Denies any leg pain or swelling, recent periods of immobilization, recent surgeries, history of DVT or PE, or hormonal contraceptive use.   Chest Pain Associated symptoms: anxiety   Anxiety Associated symptoms include chest pain.       Home Medications Prior to Admission medications   Medication Sig Start Date End Date Taking? Authorizing Provider  cetirizine (ZYRTEC) 10 MG tablet Take 10 mg by mouth daily as needed for allergies.    [provider]  cyclobenzaprine (FLEXERIL) 10 MG tablet Take 1 tablet (10 mg total) by mouth 2 (two) times daily as needed for muscle spasms. 09/23/20   Rolan Bucco, MD  LO LOESTRIN FE 1 MG-10 MCG / 10 MCG tablet TAKE 1 TABLET BY MOUTH EVERY DAY 04/16/19   Waldon Merl, PA-C  phentermine 15 MG capsule Take 1 capsule (15 mg total) by mouth every morning. 07/01/20   Janeece Agee, NP      Allergies    Patient has no known allergies.    Review of Systems   Review of Systems  Cardiovascular:  Positive for chest pain.    Physical Exam Updated Vital Signs BP (!) 110/59   Pulse 62   Temp 98.2 F (36.8 C)   Resp 18   Ht 5\' 8"  (1.727 m)   Wt 115.7 kg   LMP 01/29/2023   SpO2 99%   BMI 38.77 kg/m  Physical Exam Vitals and nursing note  reviewed.  Constitutional:      General: She is not in acute distress.    Appearance: She is well-developed.  HENT:     Head: Normocephalic and atraumatic.  Eyes:     Conjunctiva/sclera: Conjunctivae normal.  Cardiovascular:     Rate and Rhythm: Normal rate and regular rhythm.     Heart sounds: No murmur heard.    Comments: Radial pulse 2+ bilaterally Pulmonary:     Effort: Pulmonary effort is normal. No respiratory distress.     Breath sounds: Normal breath sounds.  Abdominal:     Palpations: Abdomen is soft.     Tenderness: There is no abdominal tenderness.  Musculoskeletal:        General: No swelling.     Cervical back: Neck supple.  Skin:    General: Skin is warm and dry.     Capillary Refill: Capillary refill takes less than 2 seconds.  Neurological:     Mental Status: She is alert.  Psychiatric:        Mood and Affect: Mood normal.     ED Results / Procedures / Treatments   Labs (all labs ordered are listed, but only abnormal results are displayed) Labs Reviewed  BASIC METABOLIC PANEL - Abnormal; Notable for the following components:  Result Value   Glucose, Bld 118 (*)    All other components within normal limits  CBC WITH DIFFERENTIAL/PLATELET - Abnormal; Notable for the following components:   WBC 13.4 (*)    Neutro Abs 11.3 (*)    All other components within normal limits  PREGNANCY, URINE  TROPONIN I (HIGH SENSITIVITY)    EKG EKG Interpretation Date/Time:  Sunday February 04 2023 16:11:53 EST Ventricular Rate:  83 PR Interval:  147 QRS Duration:  91 QT Interval:  361 QTC Calculation: 425 R Axis:   56  Text Interpretation: Sinus rhythm Low voltage, precordial leads no acute ST/T changes overall similar to 2016 Confirmed by Pricilla Loveless [phone]) on 02/04/2023 5:50:28 PM  Radiology DG Chest 2 View Result Date: 02/04/2023 CLINICAL DATA:  Chest pain. EXAM: CHEST - 2 VIEW COMPARISON:  None Available. FINDINGS: Bilateral lung fields are clear.  Bilateral costophrenic angles are clear. Normal cardio-mediastinal silhouette. No acute osseous abnormalities. The soft tissues are within normal limits. IMPRESSION: No active cardiopulmonary disease. Electronically Signed   By: Jules Schick M.D.   On: 02/04/2023 16:59    Procedures Procedures    Medications Ordered in ED Medications - No data to display   ED Course/ Medical Decision Making/ A&P             HEART Score: 0                    Medical Decision Making Amount and/or Complexity of Data Reviewed Labs: ordered. Radiology: ordered.  Risk OTC drugs. Prescription drug management.     Differential diagnosis includes but is not limited to ACS, arrhythmia, aortic aneurysm, pericarditis, myocarditis, pericardial effusion, cardiac tamponade, musculoskeletal pain, GERD, Boerhaave's syndrome, DVT/PE, pneumonia, pleural effusion   ED Course:  Patient very well-appearing although anxious, stable vital signs aside from a slightly elevated blood pressure 140/75.  Her chest pain has currently resolved.  Low concern for ACS at this time given pain has been on and off for the past month, troponin under 2, EKG with normal sinus rhythm, pain non-exertional, HEART score of 0. Chest x-ray without any acute abnormality. No concern for DVT or PE at this time given PERC negative.  Low concern for other etiologies such as pericarditis, myocarditis or pericardial effusion, etc. given pain is on and off, non-positional.  Lower concern for GERD at this time given patient denies any feelings of reflux or relation to food intake. I Ordered, and personally interpreted labs.  The pertinent results include:   CBC with slight leukocytosis of 13.4, no anemia BMP within normal limits Troponin under 2  Suspect this could be due to anxiety.  Patient stable and appropriate for discharge home at this time.  Impression: Atypical chest pain  Disposition:  The patient was discharged home with  instructions to follow-up with her PCP within the next month for further discussion of symptoms/management and follow-up of her blood pressure. Return precautions given.  Imaging Studies ordered: I ordered imaging studies including chest x-ray I independently visualized the imaging with scope of interpretation limited to determining acute life threatening conditions related to emergency care. Imaging showed no acute abnormality I agree with the radiologist interpretation   Cardiac Monitoring: / EKG: The patient was maintained on a cardiac monitor.  I personally viewed and interpreted the cardiac monitored which showed an underlying rhythm of: Normal sinus rhythm                Final Clinical Impression(s) / ED  Diagnoses Final diagnoses:  Atypical chest pain    Rx / DC Orders ED Discharge Orders     None         Arabella Merles, PA-C 02/04/23 1847    Pricilla Loveless, MD 02/05/23 1438

## 2023-02-04 NOTE — Discharge Instructions (Addendum)
Your workup today is reassuring. It is very unlikely that your pain is due to an issue in your heart.  Your cardiac enzyme (troponin) was normal today. Your EKG which is a measure of the heart's electrical activity and rhythm is normal today. These would both show abnormalities if you were having a heart attack.  Your chest x-ray is normal today.  Please follow-up with your PCP within the next month to discuss your symptoms and further management as this may be anxiety related.  Your blood pressure was also slightly elevated today (140/75).  Please have this rechecked at your PCP appointment within the next month to determine if you may need further management of your blood pressure.  Return to the ER if you have any shortness of breath, difficulty breathing, worsening chest pain, dizziness, jaw pain, left arm or shoulder pain, abdominal pain, unexplained fever, any other new or concerning symptoms.
